# Patient Record
Sex: Male | Born: 1995
Health system: Southern US, Community
[De-identification: ages and names within clinical notes are randomized; demographics above are authoritative.]

## PROBLEM LIST (undated history)

## (undated) DIAGNOSIS — S069XAA Unspecified intracranial injury with loss of consciousness status unknown, initial encounter: Secondary | ICD-10-CM

## (undated) DIAGNOSIS — S069X9A Unspecified intracranial injury with loss of consciousness of unspecified duration, initial encounter: Secondary | ICD-10-CM

## (undated) HISTORY — PX: APPENDECTOMY: SHX54

## (undated) HISTORY — PX: WISDOM TOOTH EXTRACTION: SHX21

## (undated) HISTORY — PX: TYMPANOSTOMY TUBE PLACEMENT: SHX32

---
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
## 2019-01-28 ENCOUNTER — Emergency Department (HOSPITAL_COMMUNITY): Payer: Medicaid Other

## 2019-01-28 ENCOUNTER — Other Ambulatory Visit: Payer: Self-pay

## 2019-01-28 ENCOUNTER — Encounter: Payer: Self-pay | Admitting: Student

## 2019-01-28 ENCOUNTER — Inpatient Hospital Stay (HOSPITAL_COMMUNITY)
Admission: EM | Admit: 2019-01-28 | Discharge: 2019-02-05 | DRG: 082 | Disposition: A | Discharge status: Rehabilitation Facility | Payer: Medicaid Other | Attending: General Surgery | Admitting: General Surgery

## 2019-01-28 ENCOUNTER — Encounter (HOSPITAL_COMMUNITY): Payer: Self-pay | Admitting: Emergency Medicine

## 2019-01-28 DIAGNOSIS — R451 Restlessness and agitation: Secondary | ICD-10-CM | POA: Diagnosis not present

## 2019-01-28 DIAGNOSIS — S3993XA Unspecified injury of pelvis, initial encounter: Secondary | ICD-10-CM | POA: Diagnosis not present

## 2019-01-28 DIAGNOSIS — R0689 Other abnormalities of breathing: Secondary | ICD-10-CM | POA: Diagnosis not present

## 2019-01-28 DIAGNOSIS — Z9911 Dependence on respirator [ventilator] status: Secondary | ICD-10-CM

## 2019-01-28 DIAGNOSIS — S0219XA Other fracture of base of skull, initial encounter for closed fracture: Secondary | ICD-10-CM | POA: Diagnosis not present

## 2019-01-28 DIAGNOSIS — S3991XA Unspecified injury of abdomen, initial encounter: Secondary | ICD-10-CM | POA: Diagnosis not present

## 2019-01-28 DIAGNOSIS — S06899A Other specified intracranial injury with loss of consciousness of unspecified duration, initial encounter: Secondary | ICD-10-CM | POA: Diagnosis not present

## 2019-01-28 DIAGNOSIS — R402432 Glasgow coma scale score 3-8, at arrival to emergency department: Secondary | ICD-10-CM | POA: Diagnosis present

## 2019-01-28 DIAGNOSIS — S0291XB Unspecified fracture of skull, initial encounter for open fracture: Secondary | ICD-10-CM | POA: Diagnosis not present

## 2019-01-28 DIAGNOSIS — L8990 Pressure ulcer of unspecified site, unspecified stage: Secondary | ICD-10-CM | POA: Insufficient documentation

## 2019-01-28 DIAGNOSIS — R4182 Altered mental status, unspecified: Secondary | ICD-10-CM | POA: Diagnosis not present

## 2019-01-28 DIAGNOSIS — S0993XA Unspecified injury of face, initial encounter: Secondary | ICD-10-CM | POA: Diagnosis not present

## 2019-01-28 DIAGNOSIS — Z7289 Other problems related to lifestyle: Secondary | ICD-10-CM

## 2019-01-28 DIAGNOSIS — Z789 Other specified health status: Secondary | ICD-10-CM

## 2019-01-28 DIAGNOSIS — Z4682 Encounter for fitting and adjustment of non-vascular catheter: Secondary | ICD-10-CM | POA: Diagnosis not present

## 2019-01-28 DIAGNOSIS — G9389 Other specified disorders of brain: Secondary | ICD-10-CM | POA: Diagnosis present

## 2019-01-28 DIAGNOSIS — S069X9A Unspecified intracranial injury with loss of consciousness of unspecified duration, initial encounter: Principal | ICD-10-CM | POA: Diagnosis present

## 2019-01-28 DIAGNOSIS — S199XXA Unspecified injury of neck, initial encounter: Secondary | ICD-10-CM | POA: Diagnosis not present

## 2019-01-28 DIAGNOSIS — R456 Violent behavior: Secondary | ICD-10-CM | POA: Diagnosis not present

## 2019-01-28 DIAGNOSIS — R4020 Unspecified coma: Secondary | ICD-10-CM | POA: Diagnosis not present

## 2019-01-28 DIAGNOSIS — S299XXA Unspecified injury of thorax, initial encounter: Secondary | ICD-10-CM | POA: Diagnosis not present

## 2019-01-28 DIAGNOSIS — S0101XA Laceration without foreign body of scalp, initial encounter: Secondary | ICD-10-CM

## 2019-01-28 DIAGNOSIS — Z781 Physical restraint status: Secondary | ICD-10-CM

## 2019-01-28 DIAGNOSIS — Z20828 Contact with and (suspected) exposure to other viral communicable diseases: Secondary | ICD-10-CM | POA: Diagnosis present

## 2019-01-28 DIAGNOSIS — S0219XB Other fracture of base of skull, initial encounter for open fracture: Secondary | ICD-10-CM | POA: Diagnosis present

## 2019-01-28 DIAGNOSIS — R404 Transient alteration of awareness: Secondary | ICD-10-CM | POA: Diagnosis not present

## 2019-01-28 DIAGNOSIS — T1490XA Injury, unspecified, initial encounter: Secondary | ICD-10-CM

## 2019-01-28 DIAGNOSIS — Y92410 Unspecified street and highway as the place of occurrence of the external cause: Secondary | ICD-10-CM

## 2019-01-28 DIAGNOSIS — F17200 Nicotine dependence, unspecified, uncomplicated: Secondary | ICD-10-CM | POA: Diagnosis present

## 2019-01-28 DIAGNOSIS — J9601 Acute respiratory failure with hypoxia: Secondary | ICD-10-CM | POA: Diagnosis present

## 2019-01-28 DIAGNOSIS — Y902 Blood alcohol level of 40-59 mg/100 ml: Secondary | ICD-10-CM | POA: Diagnosis present

## 2019-01-28 DIAGNOSIS — R1110 Vomiting, unspecified: Secondary | ICD-10-CM | POA: Diagnosis present

## 2019-01-28 DIAGNOSIS — F1290 Cannabis use, unspecified, uncomplicated: Secondary | ICD-10-CM | POA: Diagnosis present

## 2019-01-28 DIAGNOSIS — S0291XA Unspecified fracture of skull, initial encounter for closed fracture: Secondary | ICD-10-CM | POA: Diagnosis present

## 2019-01-28 DIAGNOSIS — R51 Headache: Secondary | ICD-10-CM | POA: Diagnosis not present

## 2019-01-28 DIAGNOSIS — F1590 Other stimulant use, unspecified, uncomplicated: Secondary | ICD-10-CM | POA: Diagnosis present

## 2019-01-28 LAB — CBC
HCT: 49.6 % (ref 39.0–52.0)
Hemoglobin: 16.7 g/dL (ref 13.0–17.0)
MCH: 33.7 pg (ref 26.0–34.0)
MCHC: 33.7 g/dL (ref 30.0–36.0)
MCV: 100 fL (ref 80.0–100.0)
Platelets: 298 10*3/uL (ref 150–400)
RBC: 4.96 MIL/uL (ref 4.22–5.81)
RDW: 12.3 % (ref 11.5–15.5)
WBC: 15.6 10*3/uL — ABNORMAL HIGH (ref 4.0–10.5)
nRBC: 0 % (ref 0.0–0.2)

## 2019-01-28 LAB — I-STAT CHEM 8, ED
BUN: 8 mg/dL (ref 6–20)
Calcium, Ion: 1.01 mmol/L — ABNORMAL LOW (ref 1.15–1.40)
Chloride: 106 mmol/L (ref 98–111)
Creatinine, Ser: 1 mg/dL (ref 0.61–1.24)
Glucose, Bld: 104 mg/dL — ABNORMAL HIGH (ref 70–99)
HCT: 50 % (ref 39.0–52.0)
Hemoglobin: 17 g/dL (ref 13.0–17.0)
Potassium: 3.2 mmol/L — ABNORMAL LOW (ref 3.5–5.1)
Sodium: 142 mmol/L (ref 135–145)
TCO2: 20 mmol/L — ABNORMAL LOW (ref 22–32)

## 2019-01-28 LAB — COMPREHENSIVE METABOLIC PANEL
ALT: 10 U/L (ref 0–44)
AST: 22 U/L (ref 15–41)
Albumin: 4 g/dL (ref 3.5–5.0)
Alkaline Phosphatase: 57 U/L (ref 38–126)
Anion gap: 13 (ref 5–15)
BUN: 9 mg/dL (ref 6–20)
CO2: 19 mmol/L — ABNORMAL LOW (ref 22–32)
Calcium: 8.1 mg/dL — ABNORMAL LOW (ref 8.9–10.3)
Chloride: 107 mmol/L (ref 98–111)
Creatinine, Ser: 1.12 mg/dL (ref 0.61–1.24)
GFR calc Af Amer: >60 mL/min (ref >60)
GFR calc non Af Amer: >60 mL/min (ref >60)
Glucose, Bld: 109 mg/dL — ABNORMAL HIGH (ref 70–99)
Potassium: 3.2 mmol/L — ABNORMAL LOW (ref 3.5–5.1)
Sodium: 139 mmol/L (ref 135–145)
Total Bilirubin: 0.8 mg/dL (ref 0.3–1.2)
Total Protein: 6.7 g/dL (ref 6.5–8.1)

## 2019-01-28 LAB — ETHANOL: Alcohol, Ethyl (B): 50 mg/dL — ABNORMAL HIGH (ref <10)

## 2019-01-28 LAB — PROTIME-INR
INR: 1.1 (ref 0.8–1.2)
Prothrombin Time: 14.1 seconds (ref 11.4–15.2)

## 2019-01-28 LAB — LACTIC ACID, PLASMA: Lactic Acid, Venous: 5.3 mmol/L (ref 0.5–1.9)

## 2019-01-28 MED ORDER — MIDAZOLAM HCL 2 MG/2ML IJ SOLN
INTRAMUSCULAR | Status: AC
  Filled 2019-01-28: qty 6

## 2019-01-28 MED ORDER — MIDAZOLAM HCL 5 MG/5ML IJ SOLN
INTRAMUSCULAR | Status: AC | PRN
  Administered 2019-01-28: 5 mg via INTRAVENOUS

## 2019-01-28 MED ORDER — ROCURONIUM BROMIDE 50 MG/5ML IV SOLN
INTRAVENOUS | Status: AC | PRN
  Administered 2019-01-28: 100 mg via INTRAVENOUS

## 2019-01-28 MED ORDER — PROPOFOL 1000 MG/100ML IV EMUL
INTRAVENOUS | Status: AC | PRN
  Administered 2019-01-28: 15 ug/kg/min via INTRAVENOUS

## 2019-01-28 MED ORDER — MIDAZOLAM HCL 2 MG/2ML IJ SOLN
2.0000 mg | INTRAMUSCULAR | Status: DC | PRN
  Filled 2019-01-28: qty 2

## 2019-01-28 MED ORDER — ETOMIDATE 2 MG/ML IV SOLN
INTRAVENOUS | Status: AC | PRN
  Administered 2019-01-28: 20 mg via INTRAVENOUS

## 2019-01-28 MED ORDER — FENTANYL BOLUS VIA INFUSION
50.0000 ug | INTRAVENOUS | Status: DC | PRN
  Administered 2019-01-29: 50 ug via INTRAVENOUS
  Filled 2019-01-28: qty 50

## 2019-01-28 MED ORDER — IOHEXOL 300 MG/ML  SOLN
100.0000 mL | Freq: Once | INTRAMUSCULAR | Status: AC | PRN
  Administered 2019-01-28: 100 mL via INTRAVENOUS

## 2019-01-28 MED ORDER — SODIUM CHLORIDE 0.9 % IV SOLN
INTRAVENOUS | Status: AC | PRN
  Administered 2019-01-28: 1000 mL via INTRAVENOUS

## 2019-01-28 MED ORDER — CEFAZOLIN SODIUM-DEXTROSE 2-4 GM/100ML-% IV SOLN
2.0000 g | Freq: Once | INTRAVENOUS | Status: AC
  Administered 2019-01-28: 2 g via INTRAVENOUS

## 2019-01-28 MED ORDER — SUCCINYLCHOLINE CHLORIDE 20 MG/ML IJ SOLN
INTRAMUSCULAR | Status: AC | PRN
  Administered 2019-01-28: 100 mg via INTRAVENOUS

## 2019-01-28 MED ORDER — FENTANYL 2500MCG IN NS 250ML (10MCG/ML) PREMIX INFUSION
50.0000 ug/h | INTRAVENOUS | Status: DC
  Administered 2019-01-29: 01:00:00 50 ug/h via INTRAVENOUS
  Filled 2019-01-28: qty 250

## 2019-01-28 MED ORDER — PROPOFOL 1000 MG/100ML IV EMUL
0.0000 ug/kg/min | INTRAVENOUS | Status: DC
  Administered 2019-01-29 (×2): 50 ug/kg/min via INTRAVENOUS

## 2019-01-28 MED ORDER — TETANUS-DIPHTH-ACELL PERTUSSIS 5-2.5-18.5 LF-MCG/0.5 IM SUSP
0.5000 mL | Freq: Once | INTRAMUSCULAR | Status: AC
  Administered 2019-01-28: 0.5 mL via INTRAMUSCULAR

## 2019-01-28 MED ORDER — MIDAZOLAM HCL 2 MG/2ML IJ SOLN: 2.0000 mg | INTRAMUSCULAR | Status: DC | PRN

## 2019-01-28 MED ORDER — FENTANYL CITRATE (PF) 100 MCG/2ML IJ SOLN
50.0000 ug | Freq: Once | INTRAMUSCULAR | Status: AC
  Administered 2019-01-29: 50 ug via INTRAVENOUS

## 2019-01-28 NOTE — ED Notes (Signed)
Patient transported to CT 
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
## 2019-01-28 NOTE — ED Notes (Signed)
Dr Donne Hazel paged Neurosurgery on call
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
## 2019-01-29 ENCOUNTER — Encounter (HOSPITAL_COMMUNITY): Payer: Self-pay | Admitting: Emergency Medicine

## 2019-01-29 ENCOUNTER — Other Ambulatory Visit: Payer: Self-pay

## 2019-01-29 DIAGNOSIS — J9600 Acute respiratory failure, unspecified whether with hypoxia or hypercapnia: Secondary | ICD-10-CM | POA: Diagnosis not present

## 2019-01-29 DIAGNOSIS — S0219XA Other fracture of base of skull, initial encounter for closed fracture: Secondary | ICD-10-CM | POA: Diagnosis not present

## 2019-01-29 DIAGNOSIS — S0219XB Other fracture of base of skull, initial encounter for open fracture: Secondary | ICD-10-CM | POA: Diagnosis not present

## 2019-01-29 DIAGNOSIS — Y902 Blood alcohol level of 40-59 mg/100 ml: Secondary | ICD-10-CM | POA: Diagnosis present

## 2019-01-29 DIAGNOSIS — S069X0S Unspecified intracranial injury without loss of consciousness, sequela: Secondary | ICD-10-CM | POA: Diagnosis not present

## 2019-01-29 DIAGNOSIS — S069X1D Unspecified intracranial injury with loss of consciousness of 30 minutes or less, subsequent encounter: Secondary | ICD-10-CM | POA: Diagnosis not present

## 2019-01-29 DIAGNOSIS — S199XXA Unspecified injury of neck, initial encounter: Secondary | ICD-10-CM | POA: Diagnosis not present

## 2019-01-29 DIAGNOSIS — S06899A Other specified intracranial injury with loss of consciousness of unspecified duration, initial encounter: Secondary | ICD-10-CM | POA: Diagnosis not present

## 2019-01-29 DIAGNOSIS — S069X9A Unspecified intracranial injury with loss of consciousness of unspecified duration, initial encounter: Secondary | ICD-10-CM | POA: Diagnosis present

## 2019-01-29 DIAGNOSIS — J9601 Acute respiratory failure with hypoxia: Secondary | ICD-10-CM | POA: Diagnosis present

## 2019-01-29 DIAGNOSIS — S299XXA Unspecified injury of thorax, initial encounter: Secondary | ICD-10-CM | POA: Diagnosis not present

## 2019-01-29 DIAGNOSIS — R451 Restlessness and agitation: Secondary | ICD-10-CM | POA: Diagnosis not present

## 2019-01-29 DIAGNOSIS — R402432 Glasgow coma scale score 3-8, at arrival to emergency department: Secondary | ICD-10-CM | POA: Diagnosis present

## 2019-01-29 DIAGNOSIS — Z9911 Dependence on respirator [ventilator] status: Secondary | ICD-10-CM | POA: Diagnosis not present

## 2019-01-29 DIAGNOSIS — Z4682 Encounter for fitting and adjustment of non-vascular catheter: Secondary | ICD-10-CM | POA: Diagnosis not present

## 2019-01-29 DIAGNOSIS — S062X9A Diffuse traumatic brain injury with loss of consciousness of unspecified duration, initial encounter: Secondary | ICD-10-CM | POA: Diagnosis not present

## 2019-01-29 DIAGNOSIS — S3992XA Unspecified injury of lower back, initial encounter: Secondary | ICD-10-CM | POA: Diagnosis not present

## 2019-01-29 DIAGNOSIS — R51 Headache: Secondary | ICD-10-CM | POA: Diagnosis not present

## 2019-01-29 DIAGNOSIS — R4689 Other symptoms and signs involving appearance and behavior: Secondary | ICD-10-CM | POA: Diagnosis not present

## 2019-01-29 DIAGNOSIS — Z781 Physical restraint status: Secondary | ICD-10-CM | POA: Diagnosis not present

## 2019-01-29 DIAGNOSIS — F17200 Nicotine dependence, unspecified, uncomplicated: Secondary | ICD-10-CM | POA: Diagnosis present

## 2019-01-29 DIAGNOSIS — F1590 Other stimulant use, unspecified, uncomplicated: Secondary | ICD-10-CM | POA: Diagnosis present

## 2019-01-29 DIAGNOSIS — S069X9D Unspecified intracranial injury with loss of consciousness of unspecified duration, subsequent encounter: Secondary | ICD-10-CM | POA: Diagnosis not present

## 2019-01-29 DIAGNOSIS — S0291XB Unspecified fracture of skull, initial encounter for open fracture: Secondary | ICD-10-CM | POA: Diagnosis not present

## 2019-01-29 DIAGNOSIS — S0993XA Unspecified injury of face, initial encounter: Secondary | ICD-10-CM | POA: Diagnosis not present

## 2019-01-29 DIAGNOSIS — Z20828 Contact with and (suspected) exposure to other viral communicable diseases: Secondary | ICD-10-CM | POA: Diagnosis present

## 2019-01-29 DIAGNOSIS — S0291XA Unspecified fracture of skull, initial encounter for closed fracture: Secondary | ICD-10-CM | POA: Diagnosis not present

## 2019-01-29 DIAGNOSIS — S069X2S Unspecified intracranial injury with loss of consciousness of 31 minutes to 59 minutes, sequela: Secondary | ICD-10-CM | POA: Diagnosis not present

## 2019-01-29 DIAGNOSIS — S3991XA Unspecified injury of abdomen, initial encounter: Secondary | ICD-10-CM | POA: Diagnosis not present

## 2019-01-29 DIAGNOSIS — L8990 Pressure ulcer of unspecified site, unspecified stage: Secondary | ICD-10-CM | POA: Insufficient documentation

## 2019-01-29 DIAGNOSIS — S3993XA Unspecified injury of pelvis, initial encounter: Secondary | ICD-10-CM | POA: Diagnosis not present

## 2019-01-29 DIAGNOSIS — G9389 Other specified disorders of brain: Secondary | ICD-10-CM | POA: Diagnosis present

## 2019-01-29 DIAGNOSIS — Z7289 Other problems related to lifestyle: Secondary | ICD-10-CM | POA: Diagnosis not present

## 2019-01-29 DIAGNOSIS — Y92410 Unspecified street and highway as the place of occurrence of the external cause: Secondary | ICD-10-CM | POA: Diagnosis not present

## 2019-01-29 DIAGNOSIS — F1290 Cannabis use, unspecified, uncomplicated: Secondary | ICD-10-CM | POA: Diagnosis present

## 2019-01-29 DIAGNOSIS — R1110 Vomiting, unspecified: Secondary | ICD-10-CM | POA: Diagnosis present

## 2019-01-29 DIAGNOSIS — S06360D Traumatic hemorrhage of cerebrum, unspecified, without loss of consciousness, subsequent encounter: Secondary | ICD-10-CM | POA: Diagnosis not present

## 2019-01-29 LAB — PREPARE FRESH FROZEN PLASMA
Unit division: 0
Unit division: 0

## 2019-01-29 LAB — TYPE AND SCREEN
ABO/RH(D): A POS
Antibody Screen: NEGATIVE
Unit division: 0
Unit division: 0

## 2019-01-29 LAB — BPAM RBC
Blood Product Expiration Date: 202008252359
Blood Product Expiration Date: 202008252359
ISSUE DATE / TIME: 202008182217
ISSUE DATE / TIME: 202008182217
Unit Type and Rh: 5100
Unit Type and Rh: 5100

## 2019-01-29 LAB — POCT I-STAT 7, (LYTES, BLD GAS, ICA,H+H)
Acid-base deficit: 5 mmol/L — ABNORMAL HIGH (ref 0.0–2.0)
Bicarbonate: 20.4 mmol/L (ref 20.0–28.0)
Calcium, Ion: 1.12 mmol/L — ABNORMAL LOW (ref 1.15–1.40)
HCT: 48 % (ref 39.0–52.0)
Hemoglobin: 16.3 g/dL (ref 13.0–17.0)
O2 Saturation: 100 %
Potassium: 3.5 mmol/L (ref 3.5–5.1)
Sodium: 141 mmol/L (ref 135–145)
TCO2: 22 mmol/L (ref 22–32)
pCO2 arterial: 37.2 mmHg (ref 32.0–48.0)
pH, Arterial: 7.347 — ABNORMAL LOW (ref 7.350–7.450)
pO2, Arterial: 311 mmHg — ABNORMAL HIGH (ref 83.0–108.0)

## 2019-01-29 LAB — RAPID URINE DRUG SCREEN, HOSP PERFORMED
Amphetamines: NOT DETECTED
Barbiturates: NOT DETECTED
Benzodiazepines: POSITIVE — AB
Cocaine: NOT DETECTED
Opiates: NOT DETECTED
Tetrahydrocannabinol: POSITIVE — AB

## 2019-01-29 LAB — URINALYSIS, ROUTINE W REFLEX MICROSCOPIC
Bilirubin Urine: NEGATIVE
Glucose, UA: NEGATIVE mg/dL
Hgb urine dipstick: NEGATIVE
Ketones, ur: NEGATIVE mg/dL
Leukocytes,Ua: NEGATIVE
Nitrite: NEGATIVE
Protein, ur: NEGATIVE mg/dL
Specific Gravity, Urine: 1.017 (ref 1.005–1.030)
pH: 6 (ref 5.0–8.0)

## 2019-01-29 LAB — TRIGLYCERIDES: Triglycerides: 139 mg/dL (ref <150)

## 2019-01-29 LAB — CBC
HCT: 42.1 % (ref 39.0–52.0)
Hemoglobin: 14.7 g/dL (ref 13.0–17.0)
MCH: 33.3 pg (ref 26.0–34.0)
MCHC: 34.9 g/dL (ref 30.0–36.0)
MCV: 95.5 fL (ref 80.0–100.0)
Platelets: 216 10*3/uL (ref 150–400)
RBC: 4.41 MIL/uL (ref 4.22–5.81)
RDW: 12.4 % (ref 11.5–15.5)
WBC: 13.3 10*3/uL — ABNORMAL HIGH (ref 4.0–10.5)
nRBC: 0 % (ref 0.0–0.2)

## 2019-01-29 LAB — BPAM FFP
Blood Product Expiration Date: 202009032359
Blood Product Expiration Date: 202009042359
ISSUE DATE / TIME: 202008182217
ISSUE DATE / TIME: 202008182217
Unit Type and Rh: 6200
Unit Type and Rh: 6200

## 2019-01-29 LAB — BLOOD PRODUCT ORDER (VERBAL) VERIFICATION

## 2019-01-29 LAB — ABO/RH: ABO/RH(D): A POS

## 2019-01-29 LAB — MRSA PCR SCREENING: MRSA by PCR: NEGATIVE

## 2019-01-29 LAB — CDS SEROLOGY

## 2019-01-29 LAB — SARS CORONAVIRUS 2 BY RT PCR (HOSPITAL ORDER, PERFORMED IN ~~LOC~~ HOSPITAL LAB): SARS Coronavirus 2: NEGATIVE

## 2019-01-29 LAB — HIV ANTIBODY (ROUTINE TESTING W REFLEX): HIV Screen 4th Generation wRfx: NONREACTIVE

## 2019-01-29 MED ORDER — FENTANYL BOLUS VIA INFUSION
50.0000 ug | INTRAVENOUS | Status: DC | PRN
  Filled 2019-01-29: qty 50

## 2019-01-29 MED ORDER — MIDAZOLAM HCL 2 MG/2ML IJ SOLN
2.0000 mg | INTRAMUSCULAR | Status: DC | PRN
  Filled 2019-01-29: qty 2

## 2019-01-29 MED ORDER — CEFAZOLIN SODIUM-DEXTROSE 2-4 GM/100ML-% IV SOLN: 2.0000 g | Freq: Three times a day (TID) | INTRAVENOUS | Status: DC

## 2019-01-29 MED ORDER — CEFAZOLIN SODIUM-DEXTROSE 2-4 GM/100ML-% IV SOLN
2.0000 g | Freq: Three times a day (TID) | INTRAVENOUS | Status: DC
  Administered 2019-01-29 – 2019-02-01 (×10): 2 g via INTRAVENOUS
  Filled 2019-01-29 (×12): qty 100

## 2019-01-29 MED ORDER — CHLORHEXIDINE GLUCONATE 0.12% ORAL RINSE (MEDLINE KIT)
15.0000 mL | Freq: Two times a day (BID) | OROMUCOSAL | Status: DC
  Administered 2019-01-29 – 2019-02-05 (×8): 15 mL via OROMUCOSAL

## 2019-01-29 MED ORDER — SODIUM CHLORIDE 0.9 % IV SOLN
INTRAVENOUS | Status: DC
  Filled 2019-01-29 (×2): qty 1000

## 2019-01-29 MED ORDER — MIDAZOLAM HCL 2 MG/2ML IJ SOLN
2.0000 mg | INTRAMUSCULAR | Status: DC | PRN
  Administered 2019-01-29: 2 mg via INTRAVENOUS
  Filled 2019-01-29: qty 2

## 2019-01-29 MED ORDER — ONDANSETRON HCL 4 MG/2ML IJ SOLN
4.0000 mg | Freq: Four times a day (QID) | INTRAMUSCULAR | Status: DC | PRN
  Administered 2019-01-30: 10:00:00 4 mg via INTRAVENOUS
  Filled 2019-01-29: qty 2

## 2019-01-29 MED ORDER — FENTANYL CITRATE (PF) 100 MCG/2ML IJ SOLN
INTRAMUSCULAR | Status: AC
  Filled 2019-01-29: qty 2

## 2019-01-29 MED ORDER — POTASSIUM CHLORIDE 10 MEQ/100ML IV SOLN
10.0000 meq | INTRAVENOUS | Status: AC
  Administered 2019-01-29 (×2): 10 meq via INTRAVENOUS
  Filled 2019-01-29 (×2): qty 100

## 2019-01-29 MED ORDER — FENTANYL 2500MCG IN NS 250ML (10MCG/ML) PREMIX INFUSION
50.0000 ug/h | INTRAVENOUS | Status: DC
  Administered 2019-01-29 (×2): 150 ug/h via INTRAVENOUS
  Filled 2019-01-29: qty 250

## 2019-01-29 MED ORDER — POTASSIUM CHLORIDE IN NACL 20-0.9 MEQ/L-% IV SOLN
INTRAVENOUS | Status: DC
  Administered 2019-01-29 – 2019-02-02 (×4): via INTRAVENOUS
  Filled 2019-01-29 (×5): qty 1000

## 2019-01-29 MED ORDER — PROPOFOL 1000 MG/100ML IV EMUL
0.0000 ug/kg/min | INTRAVENOUS | Status: DC
  Administered 2019-01-29: 20 ug/kg/min via INTRAVENOUS
  Administered 2019-01-29: 50 ug/kg/min via INTRAVENOUS
  Administered 2019-01-30: 20 ug/kg/min via INTRAVENOUS
  Filled 2019-01-29 (×3): qty 100

## 2019-01-29 MED ORDER — SODIUM CHLORIDE 0.9 % IV SOLN
INTRAVENOUS | Status: DC
  Administered 2019-01-29: 03:00:00 via INTRAVENOUS

## 2019-01-29 MED ORDER — SODIUM CHLORIDE 0.9 % IV BOLUS (SEPSIS)
1000.0000 mL | Freq: Once | INTRAVENOUS | Status: AC
  Administered 2019-01-29: 1000 mL via INTRAVENOUS

## 2019-01-29 MED ORDER — ORAL CARE MOUTH RINSE
15.0000 mL | OROMUCOSAL | Status: DC
  Administered 2019-01-29 – 2019-01-30 (×12): 15 mL via OROMUCOSAL

## 2019-01-29 MED ORDER — ACETAMINOPHEN 650 MG RE SUPP
650.0000 mg | Freq: Once | RECTAL | Status: AC
  Administered 2019-01-29: 02:00:00 650 mg via RECTAL
  Filled 2019-01-29: qty 1

## 2019-01-29 MED ORDER — FENTANYL CITRATE (PF) 100 MCG/2ML IJ SOLN
50.0000 ug | Freq: Once | INTRAMUSCULAR | Status: AC
  Administered 2019-01-29: 50 ug via INTRAVENOUS

## 2019-01-29 MED ORDER — CHLORHEXIDINE GLUCONATE CLOTH 2 % EX PADS
6.0000 | MEDICATED_PAD | Freq: Every day | CUTANEOUS | Status: DC
  Administered 2019-01-29 – 2019-01-31 (×3): 6 via TOPICAL

## 2019-01-29 MED ORDER — ONDANSETRON 4 MG PO TBDP: 4.0000 mg | ORAL_TABLET | Freq: Four times a day (QID) | ORAL | Status: DC | PRN

## 2019-01-29 MED FILL — Medication: Qty: 1 | Status: AC
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
## 2019-01-29 NOTE — Progress Notes (Signed)
RN been slowly weaning sedation off, Propofol completely off and Fentanyl was down to 50mcg. Patient woke up and began thrashing in bed, attempting to pull ETT, kicking legs, unable to talk to and reason with patient to get him calm. Fentanyl increased and Propofol restarted, patient resting calmly. Will attempt again later today.

## 2019-01-29 NOTE — Progress Notes (Signed)
Chaplain was paged as patient's family arrived.  Patient alerted Physician and Nurse.  Chaplain spoke to the family and went with the Physician to update the family.  Chaplain supported the patient's mother as she came bedside and was very emotional.  Chaplain offered words of comfort and support.  Chaplain gave family contact in formation for the hospital so they can call the patient's unit for an update later today.  Chaplain made sure emergency contact information is correct in Epic.  Chaplain available if further needs Durward Mallard, MDiv.     01/29/19 0151  Clinical Encounter Type  Visited With Patient;Family;Health care provider  Visit Type ED;Critical Care  Referral From Nurse  Consult/Referral To Chaplain  Spiritual Encounters  Spiritual Needs Emotional  Stress Factors  Family Stress Factors Other (Comment) (General worry about patient and his condition )
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
## 2019-01-29 NOTE — Progress Notes (Signed)
No issues or problems overnight.  Patient remains sedated and intubated.  Vital signs are stable.  He is afebrile.  Patient does not awaken to noxious stimuli.  He moves all 4 extremities semi-purposefully without asymmetry.  Agree with plans to hold sedation and work toward possible extubation.  Check follow-up head CT scan in morning.  Skull fracture unlikely to require surgical intervention.

## 2019-01-30 ENCOUNTER — Other Ambulatory Visit: Payer: Self-pay

## 2019-01-30 ENCOUNTER — Inpatient Hospital Stay (HOSPITAL_COMMUNITY): Payer: Medicaid Other

## 2019-01-30 LAB — BASIC METABOLIC PANEL
Anion gap: 9 (ref 5–15)
BUN: 10 mg/dL (ref 6–20)
CO2: 17 mmol/L — ABNORMAL LOW (ref 22–32)
Calcium: 8.7 mg/dL — ABNORMAL LOW (ref 8.9–10.3)
Chloride: 114 mmol/L — ABNORMAL HIGH (ref 98–111)
Creatinine, Ser: 1.03 mg/dL (ref 0.61–1.24)
GFR calc Af Amer: >60 mL/min (ref >60)
GFR calc non Af Amer: >60 mL/min (ref >60)
Glucose, Bld: 80 mg/dL (ref 70–99)
Potassium: 3.6 mmol/L (ref 3.5–5.1)
Sodium: 140 mmol/L (ref 135–145)

## 2019-01-30 LAB — CBC
HCT: 40.7 % (ref 39.0–52.0)
Hemoglobin: 14 g/dL (ref 13.0–17.0)
MCH: 33.4 pg (ref 26.0–34.0)
MCHC: 34.4 g/dL (ref 30.0–36.0)
MCV: 97.1 fL (ref 80.0–100.0)
Platelets: 190 10*3/uL (ref 150–400)
RBC: 4.19 MIL/uL — ABNORMAL LOW (ref 4.22–5.81)
RDW: 12.9 % (ref 11.5–15.5)
WBC: 10.8 10*3/uL — ABNORMAL HIGH (ref 4.0–10.5)
nRBC: 0 % (ref 0.0–0.2)

## 2019-01-30 LAB — TRIGLYCERIDES: Triglycerides: 78 mg/dL (ref <150)

## 2019-01-30 MED ORDER — LORAZEPAM 2 MG/ML IJ SOLN
1.0000 mg | INTRAMUSCULAR | Status: DC | PRN
  Administered 2019-01-31 – 2019-02-03 (×6): 1 mg via INTRAVENOUS
  Filled 2019-01-30 (×6): qty 1

## 2019-01-30 NOTE — Procedures (Signed)
Extubation Procedure Note  Patient Details:   Name: Billy Malone DOB: 11/29/1995 MRN: [id number]   Airway Documentation:    Vent end date: 01/30/19 Vent end time: 0923   Evaluation  O2 sats: stable throughout Complications: No apparent complications Patient did tolerate procedure well. Bilateral Breath Sounds: Clear   Yes    Patient extubated per MD order. Positive cuff leak. No stridor noted. MD and RN at bedside. Patient had episode of vomit, but fine afterwards. MD made aware. Patient won't leave oxygen on, but 95% on room air.   Herbie Baltimore 01/30/2019, 9:30 AM
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
## 2019-01-30 NOTE — Progress Notes (Signed)
No events or problems overnight.  Patient opens eyes.  Appears aware.  Agitated.  Remains intubated.  Follows commands bilaterally.  Follow-up head CT scan demonstrates stable appearance of his left posterior temporal fracture.  No evidence of underlying contusion or hemorrhage.  Overall stable from my standpoint.  Continue antibiotics for 5-day course.  Can be switched to oral Keflex once patient tolerating p.o.

## 2019-01-30 NOTE — Progress Notes (Signed)
Pt transported to and from CT without complications. 

## 2019-01-31 LAB — BASIC METABOLIC PANEL WITH GFR
Anion gap: 14 (ref 5–15)
BUN: 6 mg/dL (ref 6–20)
CO2: 21 mmol/L — ABNORMAL LOW (ref 22–32)
Calcium: 9.1 mg/dL (ref 8.9–10.3)
Chloride: 106 mmol/L (ref 98–111)
Creatinine, Ser: 1.03 mg/dL (ref 0.61–1.24)
GFR calc Af Amer: >60 mL/min
GFR calc non Af Amer: >60 mL/min
Glucose, Bld: 101 mg/dL — ABNORMAL HIGH (ref 70–99)
Potassium: 3.5 mmol/L (ref 3.5–5.1)
Sodium: 141 mmol/L (ref 135–145)

## 2019-01-31 LAB — CBC
HCT: 44.4 % (ref 39.0–52.0)
Hemoglobin: 15.1 g/dL (ref 13.0–17.0)
MCH: 33 pg (ref 26.0–34.0)
MCHC: 34 g/dL (ref 30.0–36.0)
MCV: 97.2 fL (ref 80.0–100.0)
Platelets: 213 10*3/uL (ref 150–400)
RBC: 4.57 MIL/uL (ref 4.22–5.81)
RDW: 12.1 % (ref 11.5–15.5)
WBC: 10.6 10*3/uL — ABNORMAL HIGH (ref 4.0–10.5)
nRBC: 0 % (ref 0.0–0.2)

## 2019-01-31 LAB — TRIGLYCERIDES: Triglycerides: 86 mg/dL (ref <150)

## 2019-01-31 MED ORDER — METHOCARBAMOL 500 MG PO TABS
500.0000 mg | ORAL_TABLET | Freq: Four times a day (QID) | ORAL | Status: DC | PRN
  Administered 2019-02-02 (×2): 500 mg via ORAL
  Filled 2019-01-31 (×3): qty 1

## 2019-01-31 MED ORDER — MORPHINE SULFATE (PF) 2 MG/ML IV SOLN
1.0000 mg | INTRAVENOUS | Status: DC | PRN
  Administered 2019-01-31: 1 mg via INTRAVENOUS
  Administered 2019-02-02 – 2019-02-03 (×3): 2 mg via INTRAVENOUS
  Filled 2019-01-31 (×6): qty 1

## 2019-01-31 MED ORDER — ACETAMINOPHEN 325 MG PO TABS
650.0000 mg | ORAL_TABLET | Freq: Once | ORAL | Status: AC
  Administered 2019-01-31: 650 mg via ORAL
  Filled 2019-01-31: qty 2

## 2019-01-31 MED ORDER — OXYCODONE HCL 5 MG PO TABS
5.0000 mg | ORAL_TABLET | ORAL | Status: DC | PRN
  Administered 2019-02-01 – 2019-02-03 (×6): 10 mg via ORAL
  Administered 2019-02-04: 11:00:00 5 mg via ORAL
  Administered 2019-02-04 (×2): 10 mg via ORAL
  Filled 2019-01-31 (×10): qty 2

## 2019-01-31 MED ORDER — HYDRALAZINE HCL 20 MG/ML IJ SOLN: 10.0000 mg | Freq: Four times a day (QID) | INTRAMUSCULAR | Status: DC | PRN
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
## 2019-01-31 NOTE — Progress Notes (Signed)
Overall stable.  Patient extubated yesterday.  He awakens to stimuli.  He follows some simple commands intermittently.  He is agitated otherwise.  Strength appears equal.  Wound clean and dry.  Status post minimally depressed left posterior temporal fracture.  Continue antibiotics for 5-day course.  Mobilize ad lib.Billy Malone
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
## 2019-01-31 NOTE — Progress Notes (Signed)
PT Cancellation Note  Patient Details Name: Billy Malone MRN: [id number] DOB: 1996-03-13   Cancelled Treatment:    Reason Eval/Treat Not Completed: Other (comment) RN requesting hold due to just giving pt Ativan and pt is now sleeping.  Ellamae Sia, PT, DPT Acute Rehabilitation Services Pager [phone] Office [phone]    Willy Eddy 01/31/2019, 9:39 AM
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
## 2019-01-31 NOTE — Progress Notes (Signed)
SLP Cancellation Note  Patient Details Name: Billy Malone MRN: [id number] DOB: 1996-02-26   Cancelled treatment:       Reason Eval/Treat Not Completed: Medical issues which prohibited therapy(Nursing reported that the pt has received Ativan and is now asleep. RN requested that the evaluation be deferred at this time for this reason. SLP will follow up.)  Winner Valeriano I. Hardin Negus, [place], [place] Office number [phone] Pager [place] 01/31/2019, 9:02 AM

## 2019-01-31 NOTE — Care Management (Signed)
Pt unable to undergo SBIRT assessment at this time due to cognitive impairment.   Reinaldo Raddle, RN, BSN  Trauma/Neuro ICU Case Manager [phone]

## 2019-02-01 LAB — TRIGLYCERIDES: Triglycerides: 93 mg/dL (ref <150)

## 2019-02-01 MED ORDER — CEPHALEXIN 500 MG PO CAPS: 500.0000 mg | ORAL_CAPSULE | Freq: Three times a day (TID) | ORAL | Status: DC
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
## 2019-02-01 NOTE — Progress Notes (Signed)
Rehab Admissions Coordinator Note:  Per PT and OT recommendation, this patient was screened by Jhonnie Garner for appropriateness for an Inpatient Acute Rehab Consult.  At this time, we will follow for continued progress with therapies prior to requesting consult order.    Jhonnie Garner 02/01/2019, 10:43 AM  I can be reached at [phone].
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
## 2019-02-01 NOTE — Progress Notes (Signed)
SLP Cancellation Note  Patient Details Name: Billy Malone MRN: [id number] DOB: 1996-04-10   Cancelled treatment:        Patient is lethargic. RN said he has been this way all morning. ST to follow up.   Charlynne Cousins Xzayvier Fagin, MA, CCC-SLP 02/01/2019 12:58 PM

## 2019-02-02 LAB — TRIGLYCERIDES: Triglycerides: 102 mg/dL (ref <150)
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
## 2019-02-03 LAB — TRIGLYCERIDES: Triglycerides: 113 mg/dL (ref <150)

## 2019-02-03 MED ORDER — ENOXAPARIN SODIUM 40 MG/0.4ML ~~LOC~~ SOLN
40.0000 mg | SUBCUTANEOUS | Status: DC
  Administered 2019-02-03 – 2019-02-04 (×2): 40 mg via SUBCUTANEOUS
  Filled 2019-02-03 (×3): qty 0.4

## 2019-02-03 MED ORDER — POLYETHYLENE GLYCOL 3350 17 G PO PACK: 17.0000 g | PACK | Freq: Every day | ORAL | Status: DC | PRN

## 2019-02-03 MED ORDER — DOCUSATE SODIUM 100 MG PO CAPS
100.0000 mg | ORAL_CAPSULE | Freq: Two times a day (BID) | ORAL | Status: DC
  Administered 2019-02-03 – 2019-02-04 (×3): 100 mg via ORAL
  Filled 2019-02-03 (×5): qty 1

## 2019-02-03 MED ORDER — ACETAMINOPHEN 325 MG PO TABS
650.0000 mg | ORAL_TABLET | Freq: Four times a day (QID) | ORAL | Status: DC | PRN
  Administered 2019-02-04: 650 mg via ORAL
  Filled 2019-02-03: qty 2
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
## 2019-02-03 NOTE — Progress Notes (Signed)
Overall stable.  Remains confused and somewhat agitated.  Will follow commands.  Strength symmetric.  Overall stable.  Continue supportive efforts.  No new recommendations.
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
## 2019-02-03 NOTE — Progress Notes (Signed)
Inpatient Rehabilitation-Admissions Coordinator   [facility] will request IP Rehab Consult Order at this time.   Jhonnie Garner, OTR/L  Rehab Admissions Coordinator  [phone] 02/03/2019 2:39 PM
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
## 2019-02-04 LAB — TRIGLYCERIDES: Triglycerides: 74 mg/dL (ref <150)

## 2019-02-04 MED ORDER — POLYETHYLENE GLYCOL 3350 17 G PO PACK
17.0000 g | PACK | Freq: Every day | ORAL | Status: DC
  Administered 2019-02-04: 11:00:00 17 g via ORAL
  Filled 2019-02-04 (×2): qty 1

## 2019-02-04 MED ORDER — LORAZEPAM 1 MG PO TABS: 1.0000 mg | ORAL_TABLET | ORAL | Status: DC | PRN

## 2019-02-04 NOTE — Progress Notes (Addendum)
Inpatient Rehabilitation Admissions Coordinator  Inpatient rehab consult received. Mom not at bedside. I contacted Mom and arranged to meet with her today at 2pm to begin rehab assessment.  Danne Baxter, RN, MSN Rehab Admissions Coordinator [phone] 02/04/2019 11:56 AM   I met with patient's Mom at bedside to discuss goals an expectations of an inpt rehab admit. She is in agreement. He is a great candidate for CIR. I will plan to admit Wednesday.  Danne Baxter, RN, MSN Rehab Admissions Coordinator [phone] 02/04/2019 4:22 PM
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
## 2019-02-04 NOTE — Progress Notes (Signed)
Overall stable.  No new recommendations.  Continue supportive efforts.

## 2019-02-05 ENCOUNTER — Other Ambulatory Visit: Payer: Self-pay

## 2019-02-05 ENCOUNTER — Inpatient Hospital Stay (HOSPITAL_COMMUNITY)
Admission: RE | Admit: 2019-02-05 | Discharge: 2019-02-18 | DRG: 945 | Disposition: A | Discharge status: Home / Self Care | Payer: Medicaid Other | Source: Intra-hospital | Attending: Physical Medicine & Rehabilitation | Admitting: Physical Medicine & Rehabilitation

## 2019-02-05 ENCOUNTER — Encounter (HOSPITAL_COMMUNITY): Payer: Self-pay | Admitting: Nurse Practitioner

## 2019-02-05 ENCOUNTER — Encounter (HOSPITAL_COMMUNITY): Payer: Self-pay

## 2019-02-05 DIAGNOSIS — S069X2S Unspecified intracranial injury with loss of consciousness of 31 minutes to 59 minutes, sequela: Secondary | ICD-10-CM | POA: Diagnosis not present

## 2019-02-05 DIAGNOSIS — R451 Restlessness and agitation: Secondary | ICD-10-CM | POA: Diagnosis not present

## 2019-02-05 DIAGNOSIS — S0101XA Laceration without foreign body of scalp, initial encounter: Secondary | ICD-10-CM

## 2019-02-05 DIAGNOSIS — F909 Attention-deficit hyperactivity disorder, unspecified type: Secondary | ICD-10-CM | POA: Diagnosis present

## 2019-02-05 DIAGNOSIS — F419 Anxiety disorder, unspecified: Secondary | ICD-10-CM | POA: Diagnosis present

## 2019-02-05 DIAGNOSIS — R001 Bradycardia, unspecified: Secondary | ICD-10-CM | POA: Diagnosis present

## 2019-02-05 DIAGNOSIS — D62 Acute posthemorrhagic anemia: Secondary | ICD-10-CM | POA: Diagnosis present

## 2019-02-05 DIAGNOSIS — S06890D Other specified intracranial injury without loss of consciousness, subsequent encounter: Secondary | ICD-10-CM | POA: Diagnosis not present

## 2019-02-05 DIAGNOSIS — R4689 Other symptoms and signs involving appearance and behavior: Secondary | ICD-10-CM | POA: Diagnosis not present

## 2019-02-05 DIAGNOSIS — T447X5A Adverse effect of beta-adrenoreceptor antagonists, initial encounter: Secondary | ICD-10-CM | POA: Diagnosis present

## 2019-02-05 DIAGNOSIS — S06300D Unspecified focal traumatic brain injury without loss of consciousness, subsequent encounter: Secondary | ICD-10-CM | POA: Diagnosis present

## 2019-02-05 DIAGNOSIS — D72829 Elevated white blood cell count, unspecified: Secondary | ICD-10-CM | POA: Diagnosis present

## 2019-02-05 DIAGNOSIS — Z792 Long term (current) use of antibiotics: Secondary | ICD-10-CM | POA: Diagnosis not present

## 2019-02-05 DIAGNOSIS — S069X0S Unspecified intracranial injury without loss of consciousness, sequela: Secondary | ICD-10-CM | POA: Diagnosis not present

## 2019-02-05 DIAGNOSIS — S0291XB Unspecified fracture of skull, initial encounter for open fracture: Secondary | ICD-10-CM

## 2019-02-05 DIAGNOSIS — R4587 Impulsiveness: Secondary | ICD-10-CM | POA: Diagnosis present

## 2019-02-05 DIAGNOSIS — S069X1D Unspecified intracranial injury with loss of consciousness of 30 minutes or less, subsequent encounter: Secondary | ICD-10-CM | POA: Diagnosis not present

## 2019-02-05 DIAGNOSIS — G9389 Other specified disorders of brain: Secondary | ICD-10-CM | POA: Diagnosis present

## 2019-02-05 DIAGNOSIS — Y92239 Unspecified place in hospital as the place of occurrence of the external cause: Secondary | ICD-10-CM | POA: Diagnosis present

## 2019-02-05 DIAGNOSIS — F919 Conduct disorder, unspecified: Secondary | ICD-10-CM | POA: Diagnosis present

## 2019-02-05 DIAGNOSIS — R000 Tachycardia, unspecified: Secondary | ICD-10-CM | POA: Diagnosis present

## 2019-02-05 DIAGNOSIS — S069X9A Unspecified intracranial injury with loss of consciousness of unspecified duration, initial encounter: Secondary | ICD-10-CM | POA: Diagnosis present

## 2019-02-05 DIAGNOSIS — F17210 Nicotine dependence, cigarettes, uncomplicated: Secondary | ICD-10-CM | POA: Diagnosis present

## 2019-02-05 DIAGNOSIS — S0101XD Laceration without foreign body of scalp, subsequent encounter: Secondary | ICD-10-CM

## 2019-02-05 DIAGNOSIS — Z791 Long term (current) use of non-steroidal anti-inflammatories (NSAID): Secondary | ICD-10-CM | POA: Diagnosis not present

## 2019-02-05 DIAGNOSIS — S069X0D Unspecified intracranial injury without loss of consciousness, subsequent encounter: Secondary | ICD-10-CM

## 2019-02-05 DIAGNOSIS — S069XAA Unspecified intracranial injury with loss of consciousness status unknown, initial encounter: Secondary | ICD-10-CM | POA: Diagnosis present

## 2019-02-05 DIAGNOSIS — T1490XA Injury, unspecified, initial encounter: Secondary | ICD-10-CM

## 2019-02-05 DIAGNOSIS — S069X9D Unspecified intracranial injury with loss of consciousness of unspecified duration, subsequent encounter: Secondary | ICD-10-CM | POA: Diagnosis not present

## 2019-02-05 DIAGNOSIS — S0291XA Unspecified fracture of skull, initial encounter for closed fracture: Secondary | ICD-10-CM | POA: Diagnosis present

## 2019-02-05 DIAGNOSIS — S0219XD Other fracture of base of skull, subsequent encounter for fracture with routine healing: Secondary | ICD-10-CM | POA: Diagnosis not present

## 2019-02-05 MED ORDER — OXYCODONE HCL 5 MG PO TABS
5.0000 mg | ORAL_TABLET | ORAL | Status: DC | PRN
  Administered 2019-02-05: 23:00:00 5 mg via ORAL
  Administered 2019-02-06: 20:00:00 10 mg via ORAL
  Administered 2019-02-07: 06:00:00 5 mg via ORAL
  Administered 2019-02-07: 21:00:00 10 mg via ORAL
  Administered 2019-02-07: 17:00:00 5 mg via ORAL
  Administered 2019-02-08 – 2019-02-14 (×4): 10 mg via ORAL
  Filled 2019-02-05: qty 2
  Filled 2019-02-05 (×2): qty 1
  Filled 2019-02-05 (×2): qty 2
  Filled 2019-02-05: qty 1
  Filled 2019-02-05 (×5): qty 2

## 2019-02-05 MED ORDER — PROCHLORPERAZINE MALEATE 5 MG PO TABS: 5.0000 mg | ORAL_TABLET | Freq: Four times a day (QID) | ORAL | Status: DC | PRN

## 2019-02-05 MED ORDER — METHOCARBAMOL 500 MG PO TABS
500.0000 mg | ORAL_TABLET | Freq: Four times a day (QID) | ORAL | Status: DC | PRN
  Administered 2019-02-07 – 2019-02-11 (×6): 500 mg via ORAL
  Filled 2019-02-05 (×6): qty 1

## 2019-02-05 MED ORDER — PROCHLORPERAZINE 25 MG RE SUPP: 12.5000 mg | Freq: Four times a day (QID) | RECTAL | Status: DC | PRN

## 2019-02-05 MED ORDER — POLYETHYLENE GLYCOL 3350 17 G PO PACK
17.0000 g | PACK | Freq: Every day | ORAL | Status: DC
  Administered 2019-02-06 – 2019-02-12 (×5): 17 g via ORAL
  Filled 2019-02-05 (×7): qty 1

## 2019-02-05 MED ORDER — PROCHLORPERAZINE EDISYLATE 10 MG/2ML IJ SOLN: 5.0000 mg | Freq: Four times a day (QID) | INTRAMUSCULAR | Status: DC | PRN

## 2019-02-05 MED ORDER — DIPHENHYDRAMINE HCL 12.5 MG/5ML PO ELIX
12.5000 mg | ORAL_SOLUTION | Freq: Four times a day (QID) | ORAL | Status: DC | PRN
  Administered 2019-02-07: 18:00:00 25 mg via ORAL
  Filled 2019-02-05: qty 10

## 2019-02-05 MED ORDER — ENOXAPARIN SODIUM 40 MG/0.4ML ~~LOC~~ SOLN
40.0000 mg | SUBCUTANEOUS | Status: DC
  Administered 2019-02-05 – 2019-02-06 (×2): 40 mg via SUBCUTANEOUS
  Filled 2019-02-05 (×2): qty 0.4

## 2019-02-05 MED ORDER — LORAZEPAM 0.5 MG PO TABS
0.5000 mg | ORAL_TABLET | Freq: Four times a day (QID) | ORAL | Status: DC | PRN
  Administered 2019-02-05: 23:00:00 0.5 mg via ORAL
  Filled 2019-02-05: qty 1

## 2019-02-05 MED ORDER — ACETAMINOPHEN 325 MG PO TABS
325.0000 mg | ORAL_TABLET | ORAL | Status: DC | PRN
  Administered 2019-02-05 – 2019-02-07 (×3): 650 mg via ORAL
  Administered 2019-02-11: 325 mg via ORAL
  Administered 2019-02-16: 650 mg via ORAL
  Filled 2019-02-05 (×5): qty 2

## 2019-02-05 MED ORDER — LORAZEPAM 1 MG PO TABS: 1.0000 mg | ORAL_TABLET | ORAL | Status: DC | PRN

## 2019-02-05 MED ORDER — FLEET ENEMA 7-19 GM/118ML RE ENEM: 1.0000 | ENEMA | Freq: Once | RECTAL | Status: DC | PRN

## 2019-02-05 MED ORDER — LORAZEPAM 0.5 MG PO TABS
0.5000 mg | ORAL_TABLET | Freq: Once | ORAL | Status: DC
  Filled 2019-02-05: qty 1

## 2019-02-05 MED ORDER — SENNOSIDES-DOCUSATE SODIUM 8.6-50 MG PO TABS
2.0000 | ORAL_TABLET | Freq: Every day | ORAL | Status: DC
  Administered 2019-02-05: 22:00:00 1 via ORAL
  Administered 2019-02-06 – 2019-02-11 (×4): 2 via ORAL
  Filled 2019-02-05 (×5): qty 2

## 2019-02-05 MED ORDER — GUAIFENESIN-DM 100-10 MG/5ML PO SYRP: 5.0000 mL | ORAL_SOLUTION | Freq: Four times a day (QID) | ORAL | Status: DC | PRN

## 2019-02-05 MED ORDER — BISACODYL 10 MG RE SUPP: 10.0000 mg | Freq: Every day | RECTAL | Status: DC | PRN

## 2019-02-05 MED ORDER — TRAZODONE HCL 50 MG PO TABS
25.0000 mg | ORAL_TABLET | Freq: Every evening | ORAL | Status: DC | PRN
  Administered 2019-02-06: 20:00:00 50 mg via ORAL
  Filled 2019-02-05: qty 1

## 2019-02-05 MED ORDER — ALUM & MAG HYDROXIDE-SIMETH 200-200-20 MG/5ML PO SUSP: 30.0000 mL | ORAL | Status: DC | PRN

## 2019-02-05 MED ORDER — PNEUMOCOCCAL VAC POLYVALENT 25 MCG/0.5ML IJ INJ
0.5000 mL | INJECTION | INTRAMUSCULAR | Status: DC
  Filled 2019-02-05: qty 0.5

## 2019-02-05 MED ORDER — POLYETHYLENE GLYCOL 3350 17 G PO PACK
17.0000 g | PACK | Freq: Every day | ORAL | Status: DC | PRN
  Administered 2019-02-07: 07:00:00 17 g via ORAL
  Filled 2019-02-05: qty 1

## 2019-02-05 MED ORDER — CHLORHEXIDINE GLUCONATE 0.12% ORAL RINSE (MEDLINE KIT)
15.0000 mL | Freq: Two times a day (BID) | OROMUCOSAL | Status: DC
  Administered 2019-02-05 – 2019-02-08 (×3): 15 mL via OROMUCOSAL

## 2019-02-05 MED ORDER — DOCUSATE SODIUM 100 MG PO CAPS: 100.0000 mg | ORAL_CAPSULE | Freq: Two times a day (BID) | ORAL | Status: DC

## 2019-02-05 MED ORDER — POLYETHYLENE GLYCOL 3350 17 G PO PACK
17.0000 g | PACK | Freq: Once | ORAL | Status: AC
  Administered 2019-02-05: 17:00:00 17 g via ORAL
  Filled 2019-02-05: qty 1
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
## 2019-02-05 NOTE — Progress Notes (Signed)
Patient ID: Billy Malone, male   DOB: 12-23-1995, 23 y.o.   MRN: [id number] Patient was admitted to 4W07 via enclosure bed escorted by nursing staff.  Patient unable to verbalize understanding of rehab process including fall prevention policy, personal belongings policy, and visitation policy due to altered mental status.  Patient appears to be in no immediate distress at this time.  Brita Romp, RN

## 2019-02-05 NOTE — Progress Notes (Signed)
Inpatient Rehabilitation Admissions Coordinator  Inpatient rehab bed is available to admit pt today. I have notified Brooke, [place] and will make th e arrangements to admit today. I will contact his, Mom as well as RN CM , Almyra Free.   Danne Baxter, RN, MSN Rehab Admissions Coordinator [phone] 02/05/2019 9:29 AM
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
## 2019-02-05 NOTE — Progress Notes (Signed)
Patient is up refusing to go back to bed. He got out of the enclosure bed & was noted in the hallway. He is looking for his clothes. Cursing repoeatedly, thinking that one of the nurse techs is a friend of his, hugging her, then atlking to her as if she kne him, throwing up gang signs, pacing back & forth, packing some of the pictures that are in his room, accusing staff of taking his cigarettes, threatening staff about his cigarettes, changed his clothes from his gown to a shirt & shorts, etc. He was given prn oxycodone for pain & ativan for agitation/anxiety. 4 staff members were in his room asking trying to distract him. His conversations switch very quickly from one subject to another. He was asked about his Mom & he stated he didn't know her. He actually stated her full name. After a while of waiting (this started at approximately 2040) he was not  De-escalating. The on call provider was call & security was called because he started to move toward the door & get more agitated. He swung at on of the techs (the one he thought he knew), & she was asked to leave the room. Security came & spoke to the patient & he agreed to stay in his room. He was not speaking in the manner that he was before. They left & he seemed to calm down some, but would not stay in the bed. During this whole episode he continuously bent down to pick up thing off the floor that he was dropping & standing up holding his back, complaining that it hurt. It was explained to him multiple times that he was in the hospital & that he had some injuries. He denied. He was discouraged from picking things up but wouldn't let staff get it for him. He left his room & was directed back. Now 2 staff members are in the room until another solution comes. A telemonitor was ordered & the on call provider ordered another po dose of ativan, but patient was very suspicious about the last dose & when offered pain medication, refused.

## 2019-02-06 ENCOUNTER — Inpatient Hospital Stay (HOSPITAL_COMMUNITY): Payer: Self-pay

## 2019-02-06 ENCOUNTER — Inpatient Hospital Stay (HOSPITAL_COMMUNITY): Payer: Self-pay | Admitting: Speech Pathology

## 2019-02-06 DIAGNOSIS — R4689 Other symptoms and signs involving appearance and behavior: Secondary | ICD-10-CM

## 2019-02-06 DIAGNOSIS — S069X2S Unspecified intracranial injury with loss of consciousness of 31 minutes to 59 minutes, sequela: Secondary | ICD-10-CM

## 2019-02-06 DIAGNOSIS — S069X0S Unspecified intracranial injury without loss of consciousness, sequela: Secondary | ICD-10-CM

## 2019-02-06 LAB — COMPREHENSIVE METABOLIC PANEL
ALT: 14 U/L (ref 0–44)
AST: 23 U/L (ref 15–41)
Albumin: 4.1 g/dL (ref 3.5–5.0)
Alkaline Phosphatase: 60 U/L (ref 38–126)
Anion gap: 12 (ref 5–15)
BUN: 16 mg/dL (ref 6–20)
CO2: 26 mmol/L (ref 22–32)
Calcium: 9.9 mg/dL (ref 8.9–10.3)
Chloride: 99 mmol/L (ref 98–111)
Creatinine, Ser: 1.01 mg/dL (ref 0.61–1.24)
GFR calc Af Amer: >60 mL/min (ref >60)
GFR calc non Af Amer: >60 mL/min (ref >60)
Glucose, Bld: 109 mg/dL — ABNORMAL HIGH (ref 70–99)
Potassium: 4.1 mmol/L (ref 3.5–5.1)
Sodium: 137 mmol/L (ref 135–145)
Total Bilirubin: 1 mg/dL (ref 0.3–1.2)
Total Protein: 8.3 g/dL — ABNORMAL HIGH (ref 6.5–8.1)

## 2019-02-06 LAB — CBC WITH DIFFERENTIAL/PLATELET
Abs Immature Granulocytes: 0.03 10*3/uL (ref 0.00–0.07)
Basophils Absolute: 0.1 10*3/uL (ref 0.0–0.1)
Basophils Relative: 1 %
Eosinophils Absolute: 0.3 10*3/uL (ref 0.0–0.5)
Eosinophils Relative: 3 %
HCT: 48.8 % (ref 39.0–52.0)
Hemoglobin: 17.4 g/dL — ABNORMAL HIGH (ref 13.0–17.0)
Immature Granulocytes: 0 %
Lymphocytes Relative: 20 %
Lymphs Abs: 1.7 10*3/uL (ref 0.7–4.0)
MCH: 33 pg (ref 26.0–34.0)
MCHC: 35.7 g/dL (ref 30.0–36.0)
MCV: 92.4 fL (ref 80.0–100.0)
Monocytes Absolute: 0.9 10*3/uL (ref 0.1–1.0)
Monocytes Relative: 11 %
Neutro Abs: 5.5 10*3/uL (ref 1.7–7.7)
Neutrophils Relative %: 65 %
Platelets: 368 10*3/uL (ref 150–400)
RBC: 5.28 MIL/uL (ref 4.22–5.81)
RDW: 11.6 % (ref 11.5–15.5)
WBC: 8.4 10*3/uL (ref 4.0–10.5)
nRBC: 0 % (ref 0.0–0.2)

## 2019-02-06 MED ORDER — QUETIAPINE FUMARATE 50 MG PO TABS
50.0000 mg | ORAL_TABLET | Freq: Every day | ORAL | Status: DC
  Administered 2019-02-06: 20:00:00 50 mg via ORAL
  Filled 2019-02-06: qty 1

## 2019-02-06 MED ORDER — NICOTINE 21 MG/24HR TD PT24
21.0000 mg | MEDICATED_PATCH | Freq: Every day | TRANSDERMAL | Status: DC
  Administered 2019-02-06 – 2019-02-12 (×10): 21 mg via TRANSDERMAL
  Filled 2019-02-06 (×12): qty 1

## 2019-02-06 MED ORDER — QUETIAPINE FUMARATE 25 MG PO TABS
25.0000 mg | ORAL_TABLET | Freq: Two times a day (BID) | ORAL | Status: DC | PRN
  Administered 2019-02-07 (×2): 25 mg via ORAL
  Filled 2019-02-06 (×2): qty 1

## 2019-02-06 MED ORDER — LORAZEPAM 0.5 MG PO TABS
0.5000 mg | ORAL_TABLET | Freq: Three times a day (TID) | ORAL | Status: DC | PRN
  Administered 2019-02-07 (×2): 0.5 mg via ORAL
  Filled 2019-02-06 (×2): qty 1
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
## 2019-02-06 NOTE — Progress Notes (Signed)
Patient got up a few more times & sat on the side of the bed. Eventually he layed down but was awake. After standing there for a while, staff left the room while patient offered the staff to get in his bed. He was encouraged to get some rest & lights were turned down. He was still watching television at that time. He eventually went to sleep between [place] with staff sitting outside his door just in case he got back up. Enclosure bed was discontinued due to patient being able to get out & refusals to get back in. It seemed to agitate him when he got out of it earlier. A sleep chart was started. Orders were not put in due to Epic downtime tonight. A lab technician came to draw labs & asked if was safe. This nurse went in & woke patient up who was sleeping very hard. Awoken by tactile stimuli. He let her draw his labs & was noted picking at the band aid that she placed. He is now laying in bed with his eyes closed. Will continue to monitor. Telesitter is on & in place.

## 2019-02-06 NOTE — Progress Notes (Signed)
Patients mother asked about his night.  The patient told her that he was packing up his belongings and was coming home and that he didn't sleep at all.  Mother noted to be tearful given her son's condition.  RN gave her BI booklet to go home and review that discusses normal stages of recovery from [facility].  Additional educational materials placed at bedside for her when she returns.  Reassured her that patient would be monitored throughout the day, explained the reason for video monitoring, and discussed importance of sleep at night and medication regimen that will support it.  Patients mom to return sometime tomorrow.  Patient currently resting in bed, enclosure bed open, with call bell within reach and telesitter present.  Brita Romp, RN
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
## 2019-02-06 NOTE — Plan of Care (Signed)
  Problem: Consults Goal: RH BRAIN INJURY PATIENT EDUCATION Description: Description: See Patient Education module for eduction specifics Outcome: Progressing Goal: Skin Care Protocol Initiated - if Braden Score 18 or less Description: If consults are not indicated, leave blank or document N/A Outcome: Progressing   Problem: RH BOWEL ELIMINATION Goal: RH STG MANAGE BOWEL W/MEDICATION W/ASSISTANCE Description: STG Manage Bowel with Medication with mod I Assistance. Outcome: Progressing   Problem: RH KNOWLEDGE DEFICIT BRAIN INJURY Goal: RH STG INCREASE KNOWLEDGE OF SELF CARE AFTER BRAIN INJURY Description: Patient/caregiver will verbalize understanding of brain injury recovery including medications, safety, stages of recovery, etc. With min/mod assist. Outcome: Progressing   Problem: RH BOWEL ELIMINATION Goal: RH STG MANAGE BOWEL WITH ASSISTANCE Description: STG Manage Bowel with mod I Assistance. Outcome: Not Progressing   Problem: RH SAFETY Goal: RH STG ADHERE TO SAFETY PRECAUTIONS W/ASSISTANCE/DEVICE Description: STG Adhere to Safety Precautions With min/mod Assistance/Device. Outcome: Not Progressing   Problem: RH COGNITION-NURSING Goal: RH STG USES MEMORY AIDS/STRATEGIES W/ASSIST TO PROBLEM SOLVE Description: STG Uses Memory Aids/Strategies With min/mod Assistance to Problem Solve. Outcome: Not Progressing

## 2019-02-06 NOTE — Progress Notes (Signed)
Patient information reviewed and entered into [facility] by Becky Reisha Wos, PPS coordinator. Information including medical coding, function ability, and quality indicators will be reviewed and updated through discharge.   
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
## 2019-02-07 ENCOUNTER — Inpatient Hospital Stay (HOSPITAL_COMMUNITY): Payer: Self-pay | Admitting: Physical Therapy

## 2019-02-07 ENCOUNTER — Inpatient Hospital Stay (HOSPITAL_COMMUNITY): Payer: Self-pay | Admitting: Speech Pathology

## 2019-02-07 ENCOUNTER — Inpatient Hospital Stay (HOSPITAL_COMMUNITY): Payer: Self-pay

## 2019-02-07 MED ORDER — QUETIAPINE FUMARATE 50 MG PO TABS
100.0000 mg | ORAL_TABLET | Freq: Every day | ORAL | Status: DC
  Administered 2019-02-07: 21:00:00 100 mg via ORAL
  Filled 2019-02-07: qty 2

## 2019-02-07 MED ORDER — LORAZEPAM 2 MG/ML IJ SOLN: 0.5000 mg | Freq: Four times a day (QID) | INTRAMUSCULAR | Status: DC | PRN

## 2019-02-07 MED ORDER — LORAZEPAM 0.5 MG PO TABS
0.5000 mg | ORAL_TABLET | Freq: Four times a day (QID) | ORAL | Status: DC | PRN
  Administered 2019-02-07 – 2019-02-11 (×2): 0.5 mg via ORAL
  Filled 2019-02-07: qty 1

## 2019-02-07 NOTE — Progress Notes (Signed)
Physical Therapy Session Note  Patient Details  Name: Demareon Coldwell MRN: [id number] Date of Birth: 07-11-1995  Today's Date: 02/07/2019 PT Individual Time: 1006-1016 PT Individual Time Calculation (min): 10 min   Short Term Goals: Week 1:  PT Short Term Goal 1 (Week 1): Pt will ambulate 150 ft with CGA with LRAD. PT Short Term Goal 2 (Week 1): Pt will negotiate 12 steps with B rails & CGA.  Skilled Therapeutic Interventions/Progress Updates:  Pt received in bed with eyes partially closed, RN reports pt has only received muscle relaxer. Opened blinds and turned on lights to increase pt's engagement, provided tactile stimulation but pt briefly only nods/shakes head yes/no. Offered pt something to eat and to go for walk but pt continues to lie in bed with eyes closed. Pt continues to not engage with therapist despite ongoing encouragement. Pt left in bed with telesitter in room.   Therapy Documentation Precautions:  Precautions Precautions: None Precaution Comments: easily agitated Restrictions Weight Bearing Restrictions: No   General: PT Amount of Missed Time (min): 35 Minutes PT Missed Treatment Reason: Patient unwilling to participate;Patient fatigue    Therapy/Group: Individual Therapy  [name] 02/07/2019, 10:22 AM

## 2019-02-07 NOTE — Progress Notes (Signed)
Patient became increasingly restless at lunch time and began wandering the halls.  RN stayed with patient.  Patient was asking for "a blunt" to calm his nerves.  PRN Ativan and Seroquel given.  Patient continued to wander the halls with RN present.  Becoming more difficult to redirect.  Was able to get patient back to his room and agree to stay there until the MD releases him to go home.  Enclosure bed was discontinued and removed from room today.  Will continue to monitor.  Brita Romp, RN

## 2019-02-07 NOTE — Progress Notes (Signed)
Patient came to nurses station looking for cigarettes.  Patients nicotine patch that was applied earlier is gone (may have come off during shower).  Replaced nicotine patch with new one.  Directed patient back to room.  Sheet draped at doorway to act as a reminder for the patient to call before coming out of his room.  Brita Romp, RN

## 2019-02-07 NOTE — Progress Notes (Signed)
Physical Therapy Session Note  Patient Details  Name: Billy Malone MRN: [id number] Date of Birth: Jun 22, 1995  Today's Date: 02/07/2019 PT Individual Time: 1445-1530 PT Individual Time Calculation (min): 45 min   Short Term Goals: Week 1:  PT Short Term Goal 1 (Week 1): Pt will ambulate 150 ft with CGA with LRAD. PT Short Term Goal 2 (Week 1): Pt will negotiate 12 steps with B rails & CGA.  Skilled Therapeutic Interventions/Progress Updates:    Pt received seated in bed, agreeable to PT session. No complaints of pain. Bed mobility Supervision. Sit to stand with Supervision. Ambulation to dayroom at Supervision level. Session focus on multitasking and engaging in cognitive challenge with bowling and scoring bowling game. Pt requires frequent cues to attend to bowling game and adding up his own score. Pt engages in physical challenges leaning down to pick up bowling pins to reset them for next frame and reports feeling some weakness in his LLE when bending down. Pt requests to go get some food with this therapist, redirected pt with a snack from the nurses station and back to his room. Toilet transfer with Supervision. Pt cleans toilet seat and washes his hands and cleans up sink area following toileting without prompting. Pt requires some encouragement to sit on bed at end of session. Pt with some confusion and attempting to use his call button as a telephone. Pt left seated in bed with needs in reach, telesitter present, sheet draped across doorway as deterrent for attempting to exit room.  Therapy Documentation Precautions:  Precautions Precautions: None Precaution Comments: easily agitated Restrictions Weight Bearing Restrictions: No    Therapy/Group: Individual Therapy   Excell Seltzer, PT, DPT  02/07/2019, 4:21 PM
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
## 2019-02-07 NOTE — Progress Notes (Signed)
Occupational Therapy Session Note  Patient Details  Name: Billy Malone MRN: [id number] Date of Birth: Dec 10, 1995  Today's Date: 02/07/2019 OT Individual Time: 4098-1191 OT Individual Time Calculation (min): 73 min    Short Term Goals: Week 1:  OT Short Term Goal 1 (Week 1): Pt will complete bathing and dressing with no more than min VC for seated positioning/leaning for safety awarness OT Short Term Goal 2 (Week 1): Pt will don shirt with set up OT Short Term Goal 3 (Week 1): Pt will initiate grooming wiht no more than min VC OT Short Term Goal 4 (Week 1): Pt will sustain attention to task in minimally distracting environment for 5 min OT Short Term Goal 5 (Week 1): Pt will be oriented to place with MAX VC and use of external aides PRN  Skilled Therapeutic Interventions/Progress Updates:    1:1. Pt received in dayroom with RN and MD present. Pain medication already administered but complain of LE pain. Pt ambulates back to room and selects new clothing from closet with S and question cues to gather all needed items. Pt completes shower standing with TTB in bathroom with supervision. Pt very sexually inappropriate verbally towards OT asking OT to join pt in shower, talking about spanking and wanting OT to perform sexual favors for pt in shower. At one point pt stands on TTB and OT has to sternly tell pt to get down. Pt dresses in standing with superviison with VC for seated or leaning onto wall to thread BLE into pants and dons socks. Pt sits to don shoes. Pt refuses to brush teeth. Pt completes ambulation througout unit with supervision and multiple lateral and posterior LOB, but pt able to recover. Pt plays cornhole with OT requiring MAX VC and written notation to complete scoring for both pt and OT. Pt completes standing pipe tree activity with min VC for noticing errors in figure. Pt completes 2 min on kinetron in standing and personally elects to transition to Nustep d/t decreased  attention despites VC. Exited session with pt setaedin bed, exit alarm on, telesitter in place and call light tin reah  Therapy Documentation Precautions:  Precautions Precautions: None Precaution Comments: easily agitated Restrictions Weight Bearing Restrictions: No General:   Vital Signs:  Pain: Pain Assessment Pain Scale: 0-10 Pain Score: 0-No pain Pain Type: Acute pain Pain Location: Back Pain Frequency: Intermittent Pain Onset: With Activity Patients Stated Pain Goal: 0 Pain Intervention(s): Medication (See eMAR) ADL: ADL Grooming: Unable to assess, Other (comment)(pt refused) Upper Body Bathing: Unable to assess(refused) Lower Body Bathing: Unable to assess(refused) Upper Body Dressing: Contact guard Where Assessed-Upper Body Dressing: Standing at sink Lower Body Dressing: Minimal assistance Where Assessed-Lower Body Dressing: Standing at sink Toileting: Minimal assistance(stnading) Where Assessed-Toileting: Toilet(standing) Tub/Shower Transfer: Minimal assistance Tub/Shower Transfer Method: Ambulating Tub/Shower Equipment: Landscape architect   Exercises:   Other Treatments:     Therapy/Group: Individual Therapy  Tonny Branch 02/07/2019, 8:13 AM

## 2019-02-07 NOTE — Progress Notes (Signed)
[facility] PHYSICAL MEDICINE & REHABILITATION PROGRESS NOTE   Subjective/Complaints: Slept until about 0500 today. Very active this morning. Restless  ROS: Limited due to cognitive/behavioral    Objective:   No results found. Recent Labs    02/06/19 0545  WBC 8.4  HGB 17.4*  HCT 48.8  PLT 368   Recent Labs    02/06/19 0545  NA 137  K 4.1  CL 99  CO2 26  GLUCOSE 109*  BUN 16  CREATININE 1.01  CALCIUM 9.9    Intake/Output Summary (Last 24 hours) at 02/07/2019 0952 Last data filed at 02/07/2019 0745 Gross per 24 hour  Intake 415 ml  Output -  Net 415 ml     Physical Exam: Vital Signs Blood pressure 122/89, pulse (!) 59, temperature 98.6 F (37 C), resp. rate 18, height 6' (1.829 m), SpO2 98 %.  Constitutional: No distress . Vital signs reviewed. HEENT: EOMI, oral membranes moist Neck: supple Cardiovascular: RRR without murmur. No JVD    Respiratory: CTA Bilaterally without wheezes or rales. Normal effort    GI: BS +, non-tender, non-distended  Extremities: No clubbing, cyanosis, or edema. Pulses are 2+ Skin: Clean and intact without signs of breakdown. Numerous tats on all 4's and face Neuro: oriented to self, hospital. Remains distracted and impulsive, restless. Cranial nerves 2-12 are grossly intact. Sensory exam is normal. Reflexes are 2+ in all 4's. No tremors. Motor function is grossly 4-5/5.  Musculoskeletal: Full ROM, No pain with AROM or PROM in the neck, trunk, or extremities.  Psych: disinhibited      Assessment/Plan: 1. Functional deficits secondary to TBI which require 3+ hours per day of interdisciplinary therapy in a comprehensive inpatient rehab setting.  Physiatrist is providing close team supervision and 24 hour management of active medical problems listed below.  Physiatrist and rehab team continue to assess barriers to discharge/monitor patient progress toward functional and medical goals  Care Tool:  Bathing  Bathing activity did  not occur: Refused           Bathing assist       Upper Body Dressing/Undressing Upper body dressing   What is the patient wearing?: Pull over shirt    Upper body assist Assist Level: Contact Guard/Touching assist(standing)    Lower Body Dressing/Undressing Lower body dressing      What is the patient wearing?: Pants     Lower body assist Assist for lower body dressing: Minimal Assistance - Patient > 75%(standing)     Toileting Toileting    Toileting assist Assist for toileting: Supervision/Verbal cueing     Transfers Chair/bed transfer  Transfers assist     Chair/bed transfer assist level: Supervision/Verbal cueing     Locomotion Ambulation   Ambulation assist      Assist level: Minimal Assistance - Patient > 75% Assistive device: No Device Max distance: 100   Walk 10 feet activity   Assist     Assist level: Minimal Assistance - Patient > 75% Assistive device: No Device   Walk 50 feet activity   Assist    Assist level: Minimal Assistance - Patient > 75% Assistive device: No Device    Walk 150 feet activity   Assist Walk 150 feet activity did not occur: Safety/medical concerns(agitation)         Walk 10 feet on uneven surface  activity   Assist Walk 10 feet on uneven surfaces activity did not occur: Safety/medical concerns(agitation)         Wheelchair  Assist Will patient use wheelchair at discharge?: No             Wheelchair 50 feet with 2 turns activity    Assist            Wheelchair 150 feet activity     Assist          Blood pressure 122/89, pulse (!) 59, temperature 98.6 F (37 C), resp. rate 18, height 6' (1.829 m), SpO2 98 %.  Medical Problem List and Plan: 1.Imapired function/ADLs/mobility/cognitionsecondary toTBI, depressed skull fx with [employer] Level V  -Continue CIR therapies today including PT, SLP and OT  2.  Antithrombotics: -DVT/anticoagulation:Pharmaceutical:Lovenox -antiplatelet therapy: N/A 3. Pain Management:continue oxycodone prnfor headaches 4. Mood:LCSW to follow for evaluation and support. -antipsychotic agents: increase HS seroquel to 100mg  with back up dose if needed  - avoid overstimulation  -normalize sleep-wake cycle  -may benefit from trial of ritalin for improved concentration 5. Neuropsych: This patientisNOTcapable of making decisions onhisown behalf. 6. Skin/Wound Care:Routine pressure relief measures. 7. Fluids/Electrolytes/Nutrition:good po intake.  .  8. Leucocytosis: wbc 8.4   9. Left hip pain?     LOS: 2 days A FACE TO FACE EVALUATION WAS PERFORMED  Meredith Staggers 02/07/2019, 9:52 AM

## 2019-02-07 NOTE — Progress Notes (Signed)
Patient roaming halls. This nursing staff redirected patient to his rooms several times. Patient complaint of pain, PRN oxycodone was given along with scheduled seroquel and PRN trazodone. Patient in bed with call bell at bedside.

## 2019-02-07 NOTE — Progress Notes (Signed)
Patient has continued to wander and is fixated on leaving, despite the PRN Ativan and Seroquel given earlier.  RN attempted to walk patient around unit multiple times to calm him but was unsuccessful.  Was able to convince patient to return to room to find something to watch on TV and relax.  Upon entering room RN was flipping through channels to find something that may catch his attention for now and he inappropriately touched this RNs buttocks and proceeded to climb in bed and insist that I join him.  Attempted to reorient patient to his surroundings and that I am his nurse and set boundaries.  Left patient in room watching TV with telesitter present and sheet at door for cues to stay in room.  Another nurse gave patient PRN Oxycodone and Robaxin in hopes to settle patient.  Algis Liming, PA made aware of behavior and new orders received.  Will continue to monitor.  Brita Romp, RN

## 2019-02-07 NOTE — Progress Notes (Signed)
Patient noted to be roaming the halls, bringing pancake syrup to the desk saying he needed to "take them to his brother down the hall."  Redirected patient back to room easily.  Patient preferred to eat his breakfast in the dayroom as he did yesterday.  Patient speaking tangentially switching from topic to topic without awareness of doing so.  Patient unable to be reoriented, thinks he is in jail or a psych ward.  RN stayed with patient due to safety risk.  Patient complaining of bilateral thigh pain when active.  PRN tylenol and robaxin given for patients comfort.  Left patient with OT for therapy.  Will continue to monitor.  Brita Romp, RN

## 2019-02-07 NOTE — Progress Notes (Signed)
Speech Language Pathology Daily Session Note  Patient Details  Name: Billy Malone MRN: [id number] Date of Birth: Sep 26, 1995  Today's Date: 02/07/2019 SLP Individual Time: 1350-1420 SLP Individual Time Calculation (min): 30 min  Short Term Goals: Week 1: SLP Short Term Goal 1 (Week 1): Patient will utilize external aids for orientation to place, time and situation with Mod A verbal and visual cues. SLP Short Term Goal 2 (Week 1): Patient will demonstrate sustained attention to functional tasks for ~30 minutes with Mod verbal cues for redirection. SLP Short Term Goal 3 (Week 1): Patient will demonstrate basic problem solving with familiar tasks with Mod verbal cues. SLP Short Term Goal 4 (Week 1): Patient will identify 1 physical and 1 cognitive deficit with Max multimodal cues.  Skilled Therapeutic Interventions:  Skilled treatment session focused on cognition goals. SLP recieved pt sitting in bed, agreeable to therapy. Pt's lunch tray present but pt continued to assert that the lunch tray wasn't his. Pt able to engage in basic card game for ~ 15 minutes with appropriate problem solving and sustained attention however after 15 minutes pt merged game with another game and was not able to be redirected. SLP followed pt to bathroom and pt able to void. Pt remains disoriented to situation and is poor historian of family information such as recalling his son's age. Pt handed off to CSW.      Pain    Therapy/Group: Individual Therapy  Alysha Doolan 02/07/2019, 3:42 PM

## 2019-02-07 NOTE — Progress Notes (Signed)
Patient slept well through out the night. At 05:25 patient woke up on his own, very agitated and began to roam the halls. Patient stated "I got to go home my son is waiting for me". Security was called as patient became aggressive with staff.  Patient stated his back was hurting, ativan and 5mg  of oxycodone were given. Patient now in bed with call bell at bedside.

## 2019-02-08 ENCOUNTER — Inpatient Hospital Stay (HOSPITAL_COMMUNITY): Payer: Self-pay | Admitting: Physical Therapy

## 2019-02-08 ENCOUNTER — Inpatient Hospital Stay (HOSPITAL_COMMUNITY): Payer: Self-pay

## 2019-02-08 ENCOUNTER — Inpatient Hospital Stay (HOSPITAL_COMMUNITY): Payer: Self-pay | Admitting: Speech Pathology

## 2019-02-08 MED ORDER — QUETIAPINE FUMARATE 50 MG PO TABS
50.0000 mg | ORAL_TABLET | Freq: Three times a day (TID) | ORAL | Status: DC | PRN
  Administered 2019-02-08 – 2019-02-17 (×10): 50 mg via ORAL
  Filled 2019-02-08 (×10): qty 1

## 2019-02-08 MED ORDER — QUETIAPINE FUMARATE 50 MG PO TABS
150.0000 mg | ORAL_TABLET | Freq: Every day | ORAL | Status: DC
  Administered 2019-02-08 – 2019-02-10 (×3): 150 mg via ORAL
  Filled 2019-02-08: qty 3
  Filled 2019-02-08: qty 6
  Filled 2019-02-08: qty 3

## 2019-02-08 MED ORDER — PROPRANOLOL HCL 20 MG PO TABS
20.0000 mg | ORAL_TABLET | Freq: Two times a day (BID) | ORAL | Status: DC
  Administered 2019-02-08 – 2019-02-11 (×7): 20 mg via ORAL
  Filled 2019-02-08 (×7): qty 1

## 2019-02-08 NOTE — Progress Notes (Signed)
Speech Language Pathology Daily Session Note  Patient Details  Name: Alaric Gladwin MRN: [id number] Date of Birth: Nov 14, 1995  Today's Date: 02/08/2019 SLP Individual Time: 1331-1416 SLP Individual Time Calculation (min): 45 min  Short Term Goals: Week 1: SLP Short Term Goal 1 (Week 1): Patient will utilize external aids for orientation to place, time and situation with Mod A verbal and visual cues. SLP Short Term Goal 2 (Week 1): Patient will demonstrate sustained attention to functional tasks for ~30 minutes with Mod verbal cues for redirection. SLP Short Term Goal 3 (Week 1): Patient will demonstrate basic problem solving with familiar tasks with Mod verbal cues. SLP Short Term Goal 4 (Week 1): Patient will identify 1 physical and 1 cognitive deficit with Max multimodal cues.  Skilled Therapeutic Interventions:  Pt was seen for skilled ST targeting cognitive goals.  Pt was in the bathroom with his mom upon therapist's arrival.  Pt slightly restless, looking for cigarettes but was easily redirected.  Pt was initially oriented to place and situation but this fluctuated throughout session.  SLP facilitated the session with a novel card game to address attention and problem solving goals.  Pt was able to sustain attention to task for its duration (~15 min) with mod cues for redirection but needed total assist to plan and execute a problem solving strategy.  Pt recalled route to room with min assist and was left with mom at bedside.  Continue per current plan of care.    Pain Pain Assessment Pain Scale: 0-10 Pain Score: 0-No pain  Therapy/Group: Individual Therapy  Mery Guadalupe, Selinda Orion 02/08/2019, 4:38 PM

## 2019-02-08 NOTE — IPOC Note (Signed)
Overall Plan of Care [facility](IPOC) Patient Details Name: Billy Malone MRN: [id number] DOB: 03-Aug-1995  Admitting Diagnosis: TBI (traumatic brain injury) [facility](HCC)  Hospital Problems: Principal Problem:   [facility]traumatic [facility]) [facility])     Functional Problem List: Nursing Behavior, Bowel, Endurance, Medication Management, Motor, Safety, Skin Integrity  PT Balance, Behavior, Endurance, Safety, Motor  OT Balance, Behavior, Cognition, Endurance, Pain, Safety  SLP Cognition  TR         Basic ADL's: OT Grooming, Bathing, Dressing, Toileting     Advanced  ADL's: OT       Transfers: PT Bed Mobility, Bed to Chair, Car, Furniture, Floor  OT Toilet, Tub/Shower     Locomotion: PT Ambulation, Stairs     Additional Impairments: OT None  SLP Social Cognition   Social Interaction, Problem Solving, Memory, Attention, Awareness  TR      Anticipated Outcomes Item Anticipated Outcome  Self Feeding S  Swallowing      Basic self-care  S  Toileting  S   Bathroom Transfers S  Bowel/Bladder  Mod I assist  Transfers  Supervision basic, car, floor  Locomotion  Supervision gait x 150', up/down 12 steps 2 rails, up/down 2 steps no rails  Communication     Cognition  Min A  Pain  < 3  Safety/Judgment  Min assist   Therapy Plan: PT Intensity: Minimum of 1-2 x/day ,45 to 90 minutes PT Frequency: 5 out of 7 days PT Duration Estimated Length of Stay: 13-16 OT Intensity: Minimum of 1-2 x/day, 45 to 90 minutes OT Frequency: 5 out of 7 days OT Duration/Estimated Length of Stay: 10-12 SLP Intensity: Minumum of 1-2 x/day, 30 to 90 minutes SLP Frequency: 3 to 5 out of 7 days SLP Duration/Estimated Length of Stay: 12-14 days   Due to the current state of emergency, patients may not be receiving their 3-hours of Medicare-mandated therapy.   Team Interventions: Nursing Interventions Patient/Family Education, Bowel Management, Disease Management/Prevention, Medication Management,  Discharge Planning, Psychosocial Support, Cognitive Remediation/Compensation, Skin Care/Wound Management  PT interventions Ambulation/gait training, Cognitive remediation/compensation, Discharge planning, DME/adaptive equipment instruction, Functional mobility training, Pain management, Psychosocial support, Splinting/orthotics, Therapeutic Activities, UE/LE Strength taining/ROM, Visual/perceptual remediation/compensation, Warden/rangerBalance/vestibular training, Community reintegration, Neuromuscular re-education, Equities traderatient/family education, Museum/gallery curatortair training, Therapeutic Exercise, UE/LE Coordination activities, Wheelchair propulsion/positioning  OT Interventions Warden/rangerBalance/vestibular training, Discharge planning, Pain management, Self Care/advanced ADL retraining, Therapeutic Activities, UE/LE Coordination activities, Cognitive remediation/compensation, Disease mangement/prevention, Functional mobility training, Patient/family education, Skin care/wound managment, Therapeutic Exercise, Visual/perceptual remediation/compensation, FirefighterCommunity reintegration, Fish farm managerDME/adaptive equipment instruction, Neuromuscular re-education, Psychosocial support, Splinting/orthotics, UE/LE Strength taining/ROM, Wheelchair propulsion/positioning  SLP Interventions Cognitive remediation/compensation, Internal/external aids, Therapeutic Activities, Environmental controls, Financial traderCueing hierarchy, Functional tasks, Patient/family education  TR Interventions    SW/CM Interventions Discharge Planning, Psychosocial Support, Patient/Family Education   Barriers to Discharge MD  Behavior  Nursing      PT      OT      SLP      SW       Team Discharge Planning: Destination: PT-Home ,OT- Home , SLP-Home Projected Follow-up: PT-Home health PT, OT-  Outpatient OT, SLP-Home Health SLP, Outpatient SLP, 24 hour supervision/assistance Projected Equipment Needs: PT-To be determined, OT- Tub/shower seat, SLP-None recommended by SLP Equipment Details: PT- , OT-   Patient/family involved in discharge planning: PT- Family member/caregiver,  OT-Patient unable/family or caregiver not available, SLP-Patient  MD ELOS: 10-14 days Medical Rehab Prognosis:  Excellent Assessment: The patient has been admitted for CIR therapies with the diagnosis of TBI.  The team will be addressing functional mobility, strength, stamina, balance, safety, adaptive techniques and equipment, self-care, bowel and bladder mgt, patient and caregiver education, NMR, cognitive-behavioral rx, community reentry. Goals have been set at supervision for basic mobility and self-care and supervision to min assist for cognition.   Due to the current state of emergency, patients may not be receiving their 3 hours per day of Medicare-mandated therapy.    Meredith Staggers, MD, FAAPMR      See Team Conference Notes for weekly updates to the plan of care

## 2019-02-08 NOTE — Progress Notes (Signed)
Occupational Therapy Session Note  Patient Details  Name: Billy Malone MRN: [id number] Date of Birth: 07-30-1995  Today's Date: 02/08/2019 OT Missed Time: 75 Minutes Missed Time Reason: Patient fatigue  Skilled Therapeutic Interventions/Progress Updates:    Pt asleep in room upon entering room. Attempted to arouse pt but pt unwilling/unable to open eyes. Pt given multiples opportunities to participate but he declined, keeping eyes closed. Pt left supine with all needs within reach.   Therapy Documentation Precautions:  Precautions Precautions: None Precaution Comments: easily agitated Restrictions Weight Bearing Restrictions: No   Therapy/Group: Individual Therapy  Curtis Sites 02/08/2019, 7:17 AM

## 2019-02-08 NOTE — Progress Notes (Signed)
Physical Therapy Session Note  Patient Details  Name: Billy Malone MRN: [id number] Date of Birth: 1995-11-19  Today's Date: 02/08/2019 PT Individual Time: 1100-1120 PT Individual Time Calculation (min): 20 min   Short Term Goals: Week 1:  PT Short Term Goal 1 (Week 1): Pt will ambulate 150 ft with CGA with LRAD. PT Short Term Goal 2 (Week 1): Pt will negotiate 12 steps with B rails & CGA.  Skilled Therapeutic Interventions/Progress Updates:  Pt was sleeping upon entering room. Pt aroused to name. Pt quickly concerned he ad slept in too late today. Pt independent with bed mobility. Pt is S for transfers and ambulation around room. Pt able to pick items up off the floor with S and no loss of balance. Pt performed toilet transfers with S. Pt returned to bed after toileting. Pt stated he had been up too late last night and was too tired to participate with therapy. Attempted to encourage participation pt continued to state he was too tired. Pt requested a drink. Walked out to nurses station to get drink and breakfast. Pt walked out to nurses station with S. Pt returned to room with therapist. Pt set up in bed with breakfast. Pt left sitting up in bed with breakfast and bed alarm on.   Therapy Documentation Precautions:  Precautions Precautions: None Precaution Comments: easily agitated Restrictions Weight Bearing Restrictions: No General: PT Amount of Missed Time (min): 40 Minutes PT Missed Treatment Reason: Patient fatigue;Patient unwilling to participate Vital Signs:  Pain: No c/o pain.    Therapy/Group: Individual Therapy  Dub Amis 02/08/2019, 11:54 AM

## 2019-02-08 NOTE — Progress Notes (Signed)
[facility] PHYSICAL MEDICINE & REHABILITATION PROGRESS NOTE   Subjective/Complaints: Restless last night up until late. Wandered. Sleeping when I arrived in room this morning  ROS: limited    Objective:   No results found. Recent Labs    02/06/19 0545  WBC 8.4  HGB 17.4*  HCT 48.8  PLT 368   Recent Labs    02/06/19 0545  NA 137  K 4.1  CL 99  CO2 26  GLUCOSE 109*  BUN 16  CREATININE 1.01  CALCIUM 9.9    Intake/Output Summary (Last 24 hours) at 02/08/2019 0855 Last data filed at 02/07/2019 1700 Gross per 24 hour  Intake 462 ml  Output -  Net 462 ml     Physical Exam: Vital Signs Blood pressure (!) 124/92, pulse 100, temperature 97.8 F (36.6 C), temperature source Oral, resp. rate 19, height 6' (1.829 m), SpO2 100 %.  Constitutional: No distress . Vital signs reviewed. HEENT: EOMI, oral membranes moist Neck: supple Cardiovascular: RRR without murmur. No JVD    Respiratory: CTA Bilaterally without wheezes or rales. Normal effort    GI: BS +, non-tender, non-distended  Extremities: No clubbing, cyanosis, or edema. Pulses are 2+ Skin: Clean and intact without signs of breakdown. Numerous tats on all 4's and face Neuro: resting this morning when I came in. Cranial nerves 2-12 are grossly intact. Sensory exam is normal. Reflexes are 2+ in all 4's. No tremors. Motor function is grossly 4-5/5.  Musculoskeletal: Full ROM, No pain with AROM or PROM in the neck, trunk, or extremities.  Psych: disinhibited      Assessment/Plan: 1. Functional deficits secondary to TBI which require 3+ hours per day of interdisciplinary therapy in a comprehensive inpatient rehab setting.  Physiatrist is providing close team supervision and 24 hour management of active medical problems listed below.  Physiatrist and rehab team continue to assess barriers to discharge/monitor patient progress toward functional and medical goals  Care Tool:  Bathing  Bathing activity did not  occur: Refused     Body parts bathed by helper: Right arm, Chest, Left arm, Abdomen, Front perineal area, Buttocks, Right upper leg, Left upper leg, Right lower leg, Left lower leg, Face     Bathing assist Assist Level: Supervision/Verbal cueing(MAX VC for attention to task/ redirection to appropriate comments)     Upper Body Dressing/Undressing Upper body dressing   What is the patient wearing?: Pull over shirt    Upper body assist Assist Level: Supervision/Verbal cueing    Lower Body Dressing/Undressing Lower body dressing      What is the patient wearing?: Pants     Lower body assist Assist for lower body dressing: Independent     Toileting Toileting    Toileting assist Assist for toileting: Independent     Transfers Chair/bed transfer  Transfers assist     Chair/bed transfer assist level: Independent     Locomotion Ambulation   Ambulation assist      Assist level: Supervision/Verbal cueing Assistive device: No Device Max distance: 150'   Walk 10 feet activity   Assist     Assist level: Supervision/Verbal cueing Assistive device: No Device   Walk 50 feet activity   Assist    Assist level: Supervision/Verbal cueing Assistive device: No Device    Walk 150 feet activity   Assist Walk 150 feet activity did not occur: Safety/medical concerns(agitation)  Assist level: Supervision/Verbal cueing Assistive device: No Device    Walk 10 feet on uneven surface  activity  Assist Walk 10 feet on uneven surfaces activity did not occur: Safety/medical concerns(agitation)         Wheelchair     Assist Will patient use wheelchair at discharge?: No             Wheelchair 50 feet with 2 turns activity    Assist            Wheelchair 150 feet activity     Assist          Blood pressure (!) 124/92, pulse 100, temperature 97.8 F (36.6 C), temperature source Oral, resp. rate 19, height 6' (1.829 m), SpO2 100  %.  Medical Problem List and Plan: 1.Imapired function/ADLs/mobility/cognitionsecondary toTBI, depressed skull fx with [employer] Level V  -Continue CIR therapies today including PT, SLP and OT  2. Antithrombotics: -DVT/anticoagulation:Pharmaceutical:Lovenox -antiplatelet therapy: N/A 3. Pain Management:continue oxycodone prnfor headaches 4. Mood:LCSW to follow for evaluation and support. -antipsychotic agents: increase HS seroquel to 150mg  at 2000 with 50mg  q8 hrs if needed  - avoid overstimulation  -normalize sleep-wake cycle  -begin propranolol 20mg  bid for mood stabilization  -ideally would benefit from ritalin for attention but hesitate given his substance abuse hx 5. Neuropsych: This patientisNOTcapable of making decisions onhisown behalf. 6. Skin/Wound Care:Routine pressure relief measures. 7. Fluids/Electrolytes/Nutrition:good po intake.  .  8. Leucocytosis: wbc 8.4        LOS: 3 days A FACE TO [place] 02/08/2019, 8:55 AM

## 2019-02-09 ENCOUNTER — Inpatient Hospital Stay (HOSPITAL_COMMUNITY): Payer: Self-pay

## 2019-02-09 NOTE — Plan of Care (Signed)
  Problem: Consults Goal: RH BRAIN INJURY PATIENT EDUCATION Description: Description: See Patient Education module for eduction specifics Outcome: Progressing Goal: Skin Care Protocol Initiated - if Braden Score 18 or less Description: If consults are not indicated, leave blank or document N/A Outcome: Progressing   Problem: RH BOWEL ELIMINATION Goal: RH STG MANAGE BOWEL WITH ASSISTANCE Description: STG Manage Bowel with mod I Assistance. Outcome: Progressing Goal: RH STG MANAGE BOWEL W/MEDICATION W/ASSISTANCE Description: STG Manage Bowel with Medication with mod I Assistance. Outcome: Progressing   Problem: RH SAFETY Goal: RH STG ADHERE TO SAFETY PRECAUTIONS W/ASSISTANCE/DEVICE Description: STG Adhere to Safety Precautions With min/mod Assistance/Device. Outcome: Not Progressing   Problem: RH COGNITION-NURSING Goal: RH STG USES MEMORY AIDS/STRATEGIES W/ASSIST TO PROBLEM SOLVE Description: STG Uses Memory Aids/Strategies With min/mod Assistance to Problem Solve. Outcome: Not Progressing   Problem: RH KNOWLEDGE DEFICIT BRAIN INJURY Goal: RH STG INCREASE KNOWLEDGE OF SELF CARE AFTER BRAIN INJURY Description: Patient/caregiver will verbalize understanding of brain injury recovery including medications, safety, stages of recovery, etc. With min/mod assist. Outcome: Not Progressing

## 2019-02-09 NOTE — Care Management (Signed)
[facility] Individual Statement of Services  Patient Name:  Billy Malone  Date:  02/09/2019  Welcome to the [facility].  Our goal is to provide you with an individualized program based on your diagnosis and situation, designed to meet your specific needs.  With this comprehensive rehabilitation program, you will be expected to participate in at least 3 hours of rehabilitation therapies Monday-Friday, with modified therapy programming on the weekends.  Your rehabilitation program will include the following services:  Physical Therapy (PT), Occupational Therapy (OT), Speech Therapy (ST), 24 hour per day rehabilitation nursing, Therapeutic Recreaction (TR), Neuropsychology, Case Management (Social Worker), Rehabilitation Medicine, Nutrition Services and Pharmacy Services  Weekly team conferences will be held on Tuesdays to discuss your progress.  Your Social Worker will talk with you frequently to get your input and to update you on team discussions.  Team conferences with you and your family in attendance may also be held.  Expected length of stay: 14 days   Overall anticipated outcome: supervision  Depending on your progress and recovery, your program may change. Your Social Worker will coordinate services and will keep you informed of any changes. Your Social Worker's name and contact numbers are listed  below.  The following services may also be recommended but are not provided by the [facility] will be made to provide these services after discharge if needed.  Arrangements include referral to agencies that provide these services.  Your insurance has been verified to be:  None  Your primary doctor is:  None  Pertinent information will be shared with your doctor and your insurance  company.  Social Worker:  [place], [place] or [phone]   Information discussed with and copy given to patient by: Lennart Pall, 02/09/2019, 4:09 PM

## 2019-02-09 NOTE — Progress Notes (Signed)
[facility] PHYSICAL MEDICINE & REHABILITATION PROGRESS NOTE   Subjective/Complaints: Pt up most of night. Was not agitated or irritable. Sounds as if he was up late a lot at home as well  ROS: Limited due to cognitive/behavioral   Objective:   No results found. No results for input(s): WBC, HGB, HCT, PLT in the last 72 hours. No results for input(s): NA, K, CL, CO2, GLUCOSE, BUN, CREATININE, CALCIUM in the last 72 hours.  Intake/Output Summary (Last 24 hours) at 02/09/2019 0929 Last data filed at 02/09/2019 0847 Gross per 24 hour  Intake 760 ml  Output -  Net 760 ml     Physical Exam: Vital Signs Blood pressure 127/66, pulse (!) 58, temperature 98 F (36.7 C), temperature source Oral, resp. rate 18, height 6' (1.829 m), SpO2 100 %.  Constitutional: No distress . Vital signs reviewed. HEENT: EOMI, oral membranes moist Neck: supple Cardiovascular: RRR without murmur. No JVD    Respiratory: CTA Bilaterally without wheezes or rales. Normal effort    GI: BS +, non-tender, non-distended   Extremities: No clubbing, cyanosis, or edema. Pulses are 2+ Skin: Clean and intact without signs of breakdown. Numerous tats on all 4's and face Neuro: resting this morning. Aroused easily.  Cranial nerves 2-12 are grossly intact. Sensory exam is normal. Reflexes are 2+ in all 4's. No tremors. Motor function is grossly 4-5/5.  Musculoskeletal: Full ROM, No pain with AROM or PROM in the neck, trunk, or extremities.  Psych: disinhibited and restless      Assessment/Plan: 1. Functional deficits secondary to TBI which require 3+ hours per day of interdisciplinary therapy in a comprehensive inpatient rehab setting.  Physiatrist is providing close team supervision and 24 hour management of active medical problems listed below.  Physiatrist and rehab team continue to assess barriers to discharge/monitor patient progress toward functional and medical goals  Care Tool:  Bathing  Bathing activity  did not occur: Refused     Body parts bathed by helper: Right arm, Chest, Left arm, Abdomen, Front perineal area, Buttocks, Right upper leg, Left upper leg, Right lower leg, Left lower leg, Face     Bathing assist Assist Level: Supervision/Verbal cueing(MAX VC for attention to task/ redirection to appropriate comments)     Upper Body Dressing/Undressing Upper body dressing   What is the patient wearing?: Pull over shirt    Upper body assist Assist Level: Supervision/Verbal cueing    Lower Body Dressing/Undressing Lower body dressing      What is the patient wearing?: Pants     Lower body assist Assist for lower body dressing: Independent     Toileting Toileting    Toileting assist Assist for toileting: Independent     Transfers Chair/bed transfer  Transfers assist     Chair/bed transfer assist level: Supervision/Verbal cueing     Locomotion Ambulation   Ambulation assist      Assist level: Supervision/Verbal cueing Assistive device: No Device Max distance: 150   Walk 10 feet activity   Assist     Assist level: Supervision/Verbal cueing Assistive device: No Device   Walk 50 feet activity   Assist    Assist level: Supervision/Verbal cueing Assistive device: No Device    Walk 150 feet activity   Assist Walk 150 feet activity did not occur: Safety/medical concerns(agitation)  Assist level: Supervision/Verbal cueing Assistive device: No Device    Walk 10 feet on uneven surface  activity   Assist Walk 10 feet on uneven surfaces activity did not  occur: Safety/medical concerns(agitation)         Wheelchair     Assist Will patient use wheelchair at discharge?: No             Wheelchair 50 feet with 2 turns activity    Assist            Wheelchair 150 feet activity     Assist          Blood pressure 127/66, pulse (!) 58, temperature 98 F (36.7 C), temperature source Oral, resp. rate 18, height 6'  (1.829 m), SpO2 100 %.  Medical Problem List and Plan: 1.Imapired function/ADLs/mobility/cognitionsecondary toTBI, depressed skull fx with [employer] Level V  -Continue CIR therapies today including PT, SLP and OT  2. Antithrombotics: -DVT/anticoagulation:Pharmaceutical:Lovenox -antiplatelet therapy: N/A 3. Pain Management:continue oxycodone prnfor headaches 4. Mood:LCSW to follow for evaluation and support. -antipsychotic agents: continue HS seroquel to 150mg  at 2000 with 50mg  q8 hrs if needed  -continue to  avoid overstimulation  -normalizing sleep-wake cycle may be difficult given prior sleep habits  -continue propranolol 20mg  bid for mood stabilization  -ideally would benefit from ritalin for attention but hesitate given his substance abuse hx 5. Neuropsych: This patientisNOTcapable of making decisions onhisown behalf. 6. Skin/Wound Care:Routine pressure relief measures. 7. Fluids/Electrolytes/Nutrition:good po intake.  .  8. Leucocytosis: wbc 8.4        LOS: 4 days A FACE TO FACE EVALUATION WAS PERFORMED  Ranelle OysterZachary T  02/09/2019, 9:29 AM
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
## 2019-02-09 NOTE — Progress Notes (Signed)
Patient asleep from about 0730-1300. Patient cooperative but alert to self only upon awakening. Patient refused pain medications but did take nicotine patch-reinforced with tegaderm. Patient mom currently at bedside. Patient perseverating on needing a cigarette but redirectable at this time. Continue to monitor.

## 2019-02-10 ENCOUNTER — Inpatient Hospital Stay (HOSPITAL_COMMUNITY): Payer: Self-pay | Admitting: Speech Pathology

## 2019-02-10 ENCOUNTER — Inpatient Hospital Stay (HOSPITAL_COMMUNITY): Payer: Self-pay

## 2019-02-10 MED ORDER — ENSURE ENLIVE PO LIQD: 237.0000 mL | Freq: Two times a day (BID) | ORAL | Status: DC

## 2019-02-10 NOTE — Progress Notes (Signed)
Speech Language Pathology Daily Session Note  Patient Details  Name: Greogry Goodwyn MRN: [id number] Date of Birth: 1996/03/07  Today's Date: 02/10/2019 SLP Individual Time: 1330-1410 SLP Individual Time Calculation (min): 40 min  Short Term Goals: Week 1: SLP Short Term Goal 1 (Week 1): Patient will utilize external aids for orientation to place, time and situation with Mod A verbal and visual cues. SLP Short Term Goal 2 (Week 1): Patient will demonstrate sustained attention to functional tasks for ~30 minutes with Mod verbal cues for redirection. SLP Short Term Goal 3 (Week 1): Patient will demonstrate basic problem solving with familiar tasks with Mod verbal cues. SLP Short Term Goal 4 (Week 1): Patient will identify 1 physical and 1 cognitive deficit with Max multimodal cues.  Skilled Therapeutic Interventions: Skilled treatment session focused on cognitive goals. SLP facilitated session by providing Mod A verbal cues for functional problem solving during tray set-up.  SLP also facilitated session by providing Max A verbal cues for sustained attention to tasks but overall supervision for basic problem solving with a money management task. Patient with continued restlessness and language of confusion with perseveration on smoking, etc but could be redirected this session. Patient left upright in bed with all needs within reach. Continue with current plan of care.      Pain No/Denies Pain   Therapy/Group: Individual Therapy  Jacquel Redditt 02/10/2019, 3:11 PM

## 2019-02-10 NOTE — Progress Notes (Signed)
[facility] PHYSICAL MEDICINE & REHABILITATION PROGRESS NOTE   Subjective/Complaints: Per sleep chart, pt slept most of the night- he denies pain and said he slept OK.  There is a simple light barrier at door to remind pt to not leave room- found in room/in bed, acting more appropriately this AM- asking for more food- finished breakfast tray,.   ROS: Limited due to cognitive/behavioral   Objective:   No results found. No results for input(s): WBC, HGB, HCT, PLT in the last 72 hours. No results for input(s): NA, K, CL, CO2, GLUCOSE, BUN, CREATININE, CALCIUM in the last 72 hours.  Intake/Output Summary (Last 24 hours) at 02/10/2019 0921 Last data filed at 02/09/2019 1900 Gross per 24 hour  Intake 480 ml  Output -  Net 480 ml     Physical Exam: Vital Signs Blood pressure 115/68, pulse 69, temperature 97.8 F (36.6 C), temperature source Oral, resp. rate 17, height 6' (1.829 m), SpO2 100 %.  Constitutional: No distress . Vital signs reviewed. Awake, more alert, more appropriate, sitting up in bed, perseverating on wanting more food, NAD HEENT: EOMI, oral membranes moist Neck: supple Cardiovascular: RRR without murmur. No JVD    Respiratory: CTA Bilaterally without wheezes or rales. Normal effort    GI: BS +, non-tender, non-distended   Extremities: No clubbing, cyanosis, or edema. Pulses are 2+ Skin: Clean and intact without signs of breakdown. Numerous tats on all 4's and face Neuro: resting this morning. Aroused easily.  Cranial nerves 2-12 are grossly intact. Sensory exam is normal. Reflexes are 2+ in all 4's. No tremors. Motor function is grossly 4-5/5.  Musculoskeletal: Full ROM, No pain with AROM or PROM in the neck, trunk, or extremities.  Psych: disinhibited and restless usually, but better this AM      Assessment/Plan: 1. Functional deficits secondary to TBI which require 3+ hours per day of interdisciplinary therapy in a comprehensive inpatient rehab  setting.  Physiatrist is providing close team supervision and 24 hour management of active medical problems listed below.  Physiatrist and rehab team continue to assess barriers to discharge/monitor patient progress toward functional and medical goals  Care Tool:  Bathing  Bathing activity did not occur: Refused Body parts bathed by patient: Right arm, Left upper leg, Right lower leg, Left arm, Chest, Front perineal area, Left lower leg, Abdomen, Face, Buttocks, Right upper leg   Body parts bathed by helper: Right arm, Chest, Left arm, Abdomen, Front perineal area, Buttocks, Right upper leg, Left upper leg, Right lower leg, Left lower leg, Face     Bathing assist Assist Level: Supervision/Verbal cueing     Upper Body Dressing/Undressing Upper body dressing   What is the patient wearing?: Pull over shirt    Upper body assist Assist Level: Independent    Lower Body Dressing/Undressing Lower body dressing      What is the patient wearing?: Pants     Lower body assist Assist for lower body dressing: Independent     Toileting Toileting    Toileting assist Assist for toileting: Independent     Transfers Chair/bed transfer  Transfers assist     Chair/bed transfer assist level: Independent     Locomotion Ambulation   Ambulation assist      Assist level: Supervision/Verbal cueing Assistive device: No Device Max distance: 150   Walk 10 feet activity   Assist     Assist level: Supervision/Verbal cueing Assistive device: No Device   Walk 50 feet activity   Assist  Assist level: Supervision/Verbal cueing Assistive device: No Device    Walk 150 feet activity   Assist Walk 150 feet activity did not occur: Safety/medical concerns(agitation)  Assist level: Supervision/Verbal cueing Assistive device: No Device    Walk 10 feet on uneven surface  activity   Assist Walk 10 feet on uneven surfaces activity did not occur: Safety/medical  concerns(agitation)         Wheelchair     Assist Will patient use wheelchair at discharge?: No             Wheelchair 50 feet with 2 turns activity    Assist            Wheelchair 150 feet activity     Assist          Blood pressure 115/68, pulse 69, temperature 97.8 F (36.6 C), temperature source Oral, resp. rate 17, height 6' (1.829 m), SpO2 100 %.  Medical Problem List and Plan: 1.Imapired function/ADLs/mobility/cognitionsecondary toTBI, depressed skull fx with [employer] Level V  -Continue CIR therapies today including PT, SLP and OT  8/31- V to VI [place] score- however continues to try and wander when can't remember where to be.  2. Antithrombotics: -DVT/anticoagulation:Pharmaceutical:Lovenox -antiplatelet therapy: N/A 3. Pain Management:continue oxycodone prnfor headaches 4. Mood:LCSW to follow for evaluation and support. -antipsychotic agents: continue HS seroquel to 150mg  at 2000 with 50mg  q8 hrs if needed  -continue to  avoid overstimulation  -normalizing sleep-wake cycle may be difficult given prior sleep habits  -continue propranolol 20mg  bid for mood stabilization  -ideally would benefit from ritalin for attention but hesitate given his substance abuse hx 5. Neuropsych: This patientisNOTcapable of making decisions onhisown behalf. 6. Skin/Wound Care:Routine pressure relief measures. 7. Fluids/Electrolytes/Nutrition:good po intake.  .  8. Leucocytosis: wbc 8.4     9/ nutrition- 8/31- will ask nutrition if can make double portions.  LOS: 5 days A FACE TO FACE EVALUATION WAS PERFORMED  Adreena Willits 02/10/2019, 9:21 AM

## 2019-02-10 NOTE — Progress Notes (Signed)
Physical Therapy Session Note  Patient Details  Name: Billy Malone MRN: [id number] Date of Birth: Nov 07, 1995  Today's Date: 02/10/2019 PT Individual Time: 1030-1130 PT Individual Time Calculation (min): 60 min   Short Term Goals: Week 1:  PT Short Term Goal 1 (Week 1): Pt will ambulate 150 ft with CGA with LRAD. PT Short Term Goal 2 (Week 1): Pt will negotiate 12 steps with B rails & CGA.  Skilled Therapeutic Interventions/Progress Updates:  Pt resting in bed.  He stated that he was too tired to use any exercise machines, but would like to walk.  Gait on level tile and up/down 12 steps no rails with supervision, without LOB or unsteadiness, on unit.  Pt stooped over to pick up objects off of floor repeatedly without LOB.  Pt engaged in a variety of activities with min/mod cues for attention to task.  He used re-bounder with 1 KG and 3 KG balls without dropping balls or LOB.  He stood at high table standing on [employer]ire[employer] mat for balance challenge, and completed 4 complex PVC pipe puzzles with min cues for choosing appropriately sized pieces with 80 % accuracy, and no LOB.  When PT pointed to diagram and asked leading questions, pt was able to self correct.   He played horseshoes, taking turns accurately and winning 1/2 games.    PT assisted pt to don gloves and pt cleaned a mat and table with fair accuracy.   Pt continues to confabulate about his cousin.  At end of session, pt was able to choose this PT's name with a choice of 2.  Pt left in his room with sheet across doorway to re-direct him I he attempts to leave room.     Therapy Documentation Precautions:  Precautions Precautions: None Precaution Comments: easily agitated Restrictions Weight Bearing Restrictions: No   Pain: pt denied        Therapy/Group: Individual Therapy  Athalia Setterlund 02/10/2019, 12:20 PM

## 2019-02-10 NOTE — Progress Notes (Signed)
Occupational Therapy Session Note  Patient Details  Name: Billy Malone MRN: [id number] Date of Birth: 05/29/1996  Today's Date: 02/10/2019 OT Individual Time: 0845-0955 OT Individual Time Calculation (min): 70 min    Short Term Goals: Week 1:  OT Short Term Goal 1 (Week 1): Pt will complete bathing and dressing with no more than min VC for seated positioning/leaning for safety awarness OT Short Term Goal 2 (Week 1): Pt will don shirt with set up OT Short Term Goal 3 (Week 1): Pt will initiate grooming wiht no more than min VC OT Short Term Goal 4 (Week 1): Pt will sustain attention to task in minimally distracting environment for 5 min OT Short Term Goal 5 (Week 1): Pt will be oriented to place with MAX VC and use of external aides PRN  Skilled Therapeutic Interventions/Progress Updates:    Pt received supine, watching tv with no c/o pain. Pt engaged in appropriate conversation with therapist while he ate breakfast. Pt frequently redirected to orientation, especially place, but pt was unable to carryover any information, stating he was in [address] every couple of minutes. PO intake encouraged, as pt sat up in bed. Pt completed functional mobility around room to look for a cigarette, with mod I. Pt ripped nicotine patch off his arm when he couldn't find cigarette. Pt completed functional mobility to the therapy gym with mod I, allowing redirection from therapist. Pt held 7 lb dumbbells and completed brief BUE strengthening circuit, allowing little direction from therapist on what to do. Pt also completed nustep for several minutes too before becoming frustrated with hamstring pain.  Pt then completed Wii bowling game to address turn taking, appropriate interaction with OT, and dynamic standing balance. Pt became more agitated and was redirected to return to room. Pt was left sitting up on his bed with all needs met.   Therapy Documentation Precautions:  Precautions Precautions:  None Precaution Comments: easily agitated Restrictions Weight Bearing Restrictions: No  Therapy/Group: Individual Therapy   H  02/10/2019, 7:10 AM  

## 2019-02-10 NOTE — Progress Notes (Signed)
So far in this shift, patient has come out of his room twice, once in the hallway & then again when he came down to the nurses station looking for his girlfriend. He went back to his room without being told to. No distress noted. At this time he is asleep. Will continue to monitor.
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
## 2019-02-10 NOTE — Plan of Care (Signed)
  Problem: Consults Goal: RH BRAIN INJURY PATIENT EDUCATION Description: Description: See Patient Education module for eduction specifics Outcome: Progressing Goal: Skin Care Protocol Initiated - if Braden Score 18 or less Description: If consults are not indicated, leave blank or document N/A Outcome: Progressing   Problem: RH BOWEL ELIMINATION Goal: RH STG MANAGE BOWEL WITH ASSISTANCE Description: STG Manage Bowel with mod I Assistance. Outcome: Progressing Goal: RH STG MANAGE BOWEL W/MEDICATION W/ASSISTANCE Description: STG Manage Bowel with Medication with mod I Assistance. Outcome: Progressing   Problem: RH SAFETY Goal: RH STG ADHERE TO SAFETY PRECAUTIONS W/ASSISTANCE/DEVICE Description: STG Adhere to Safety Precautions With min/mod Assistance/Device. Outcome: Progressing   Problem: RH COGNITION-NURSING Goal: RH STG USES MEMORY AIDS/STRATEGIES W/ASSIST TO PROBLEM SOLVE Description: STG Uses Memory Aids/Strategies With min/mod Assistance to Problem Solve. Outcome: Progressing   Problem: RH KNOWLEDGE DEFICIT BRAIN INJURY Goal: RH STG INCREASE KNOWLEDGE OF SELF CARE AFTER BRAIN INJURY Description: Patient/caregiver will verbalize understanding of brain injury recovery including medications, safety, stages of recovery, etc. With min/mod assist. Outcome: Progressing

## 2019-02-11 ENCOUNTER — Inpatient Hospital Stay (HOSPITAL_COMMUNITY): Payer: Self-pay

## 2019-02-11 ENCOUNTER — Inpatient Hospital Stay (HOSPITAL_COMMUNITY): Payer: Self-pay | Admitting: Physical Therapy

## 2019-02-11 ENCOUNTER — Inpatient Hospital Stay (HOSPITAL_COMMUNITY): Payer: Self-pay | Admitting: Speech Pathology

## 2019-02-11 MED ORDER — ACETAMINOPHEN 325 MG PO TABS: 325.0000 mg | ORAL_TABLET | ORAL | Status: DC | PRN

## 2019-02-11 MED ORDER — PROPRANOLOL HCL 20 MG PO TABS
20.0000 mg | ORAL_TABLET | Freq: Three times a day (TID) | ORAL | Status: DC
  Administered 2019-02-11 – 2019-02-13 (×7): 20 mg via ORAL
  Filled 2019-02-11 (×9): qty 1

## 2019-02-11 MED ORDER — LORAZEPAM 2 MG/ML IJ SOLN
INTRAMUSCULAR | Status: AC
  Administered 2019-02-11: 20:00:00 1 mg via INTRAMUSCULAR
  Filled 2019-02-11: qty 1

## 2019-02-11 MED ORDER — METHYLPHENIDATE HCL ER (OSM) 18 MG PO TBCR
18.0000 mg | EXTENDED_RELEASE_TABLET | Freq: Every day | ORAL | Status: DC
  Administered 2019-02-11 – 2019-02-13 (×2): 18 mg via ORAL
  Filled 2019-02-11 (×2): qty 1

## 2019-02-11 MED ORDER — CLONAZEPAM 0.5 MG PO TABS
1.0000 mg | ORAL_TABLET | Freq: Once | ORAL | Status: AC
  Administered 2019-02-11: 1 mg via ORAL
  Filled 2019-02-11: qty 2

## 2019-02-11 MED ORDER — QUETIAPINE FUMARATE 50 MG PO TABS
200.0000 mg | ORAL_TABLET | Freq: Every day | ORAL | Status: DC
  Administered 2019-02-11 – 2019-02-16 (×6): 200 mg via ORAL
  Filled 2019-02-11: qty 4
  Filled 2019-02-11: qty 8
  Filled 2019-02-11 (×4): qty 4

## 2019-02-11 MED ORDER — LORAZEPAM 2 MG/ML IJ SOLN
1.0000 mg | Freq: Once | INTRAMUSCULAR | Status: AC
  Administered 2019-02-11: 20:00:00 1 mg via INTRAMUSCULAR

## 2019-02-11 MED ORDER — NICOTINE 21 MG/24HR TD PT24: 21.0000 mg | MEDICATED_PATCH | Freq: Every day | TRANSDERMAL | 0 refills | Status: DC

## 2019-02-11 NOTE — Significant Event (Signed)
Patient becoming more perseverative on leaving the hospital.  He became angry about not being allowed to leave and started cussing and yelling.  Patient unable to be redirected.  Security was called to assist.  Patient attempting to climb out of window in room.  He pried the window open.  Facilities called to come bolt window shut.  Security present and patient continues to escalate.  Brita Romp, RN

## 2019-02-11 NOTE — Progress Notes (Signed)
Patient has been restless as usual throughout the day.  RN typically able to redirect patient to play cards in the dayroom when he fixates on leaving.  Mom presented to visit and patient became very agitated, becoming verbally aggressive saying he wants to punch the doctor in the face.  Bryson Ha, PT directed mother out of the room while NT and RN stayed with patient.  Patient allowed to deescalate by a calming presence, allowing him to express his feelings, and eventually changing the subject which took over an hour.  Mother asked to leave due to him becoming escalated during her visit and was provided with education.  PRN seroquel given.  Will continue to monitor.  Brita Romp, RN

## 2019-02-11 NOTE — Significant Event (Signed)
Patient is becoming more sexually inappropriate as the day goes on.  He also was noted to have a pill crusher with some white substance crushed inside.  He told the NT it was some "Perc 10s" and he wanted to share it with the RN and NT to snort.  Patient not willing to show me the pill crusher when I asked him about it.  He agreed to show me the pill crusher with the "meds" if I came back to his room with him.  NT and I followed him.  There was some questionable white substance which appeared grainy like sugar or salt.  Patient was about to snort the substance when NT wiped it off into the floor.  Patient redirected to the nurses station for closer observation.  Brita Romp, RN

## 2019-02-11 NOTE — Progress Notes (Addendum)
Physical Therapy Session Note  Patient Details  Name: Billy Malone MRN: [id number] Date of Birth: Jun 09, 1996  Today's Date: 02/11/2019 PT Individual Time: 8032-1224 PT Individual Time Calculation (min): 30 min   Short Term Goals: Week 1:  PT Short Term Goal 1 (Week 1): Pt will ambulate 150 ft with CGA with LRAD. PT Short Term Goal 2 (Week 1): Pt will negotiate 12 steps with B rails & CGA.  Skilled Therapeutic Interventions/Progress Updates:   Pt received in care of RN at nurses station. Pt w/ increased restlessness and verbal/motor perseverations, ABS score of 30 at this time. Pt perseverating on going home, had his bags in his hands. Pt easily redirected to play cards with this therapist and RN, however refused to let his belongings out of his sight. Session focused on orienting to environment and de-escalation strategies. Pt sat to play cards and then stood to play Wii w/ therapist, independent for all mobility. Pt stated his car was in the parking lot and he was at a [employer] school, disoriented to environment and situation however would occasionally state "I've been here for 2 weeks and these doctors can't tell me what to do". Pt verbalized understanding importance of staying in hospital to get better, however would continue to state he needed to see his son and would be back this evening. Pt was unable to be re-directed despite multiple attempts by this therapist and pt's RN, and when given options for going back to dayroom or going back to room. Pt w/ bags in hands and standing 20' from locked unit doors, agitation was slowly increasing. Pt was only able to be re-directed by a male staff member requesting him to stay. Pt then agreeable to go to dayroom w/ his RN. Ended session in care of RN.   Addendum:  Environmental modifications made after discussion w/ therapy and RN - doors on pt's side of dayroom to remain open while doors on far side to remain closed. This would allow pt to be in  dayroom w/o being able to see locked doors of unit. Also discussed directing pt to stay in day room or on his side of nurses station as that would also allow him to stay out of sight of locked unit doors. Also moved couch to front of dayroom TV and turned on [employer] (pt's favorite) in event he would de-escalate enough to be left in dayroom, goal to simulate home environment for relaxation. RN agreeable and will pass along information.   Therapy Documentation Precautions:  Precautions Precautions: None Precaution Comments: easily agitated Restrictions Weight Bearing Restrictions: No   Therapy/Group: Individual Therapy  Brentley Landfair Clent Demark 02/11/2019, 4:36 PM

## 2019-02-11 NOTE — Progress Notes (Signed)
At shift change, patient was trying to leave the unit with his girlfriend. He was fixated on the room & stated that it was not his room & he was going to his room over yonder( pointing to out the window). Patient could not be reasoned with. His girlfriend tried to talk to him, but he was determined to leave. His mother called & was told about the situation. 1st shift nurse & nurse techs were in his room trying to distract him, but it did not work. He was getting louder. He had packed up his stuff. While on the phone with his mother, 1st shift nurse asked her to hang up so she could call from the cell phone & she could talk to him. He cursed his mother out & was still determined to leave. His girlfriend stayed until security was present. She left & security stayed. Attempted to give him his meds & he stated that this nurse was trying to overdose him when he saw the amount of pills. He had seroquel & senna s, the seroquel was 25mg  tablets & his dose was 150mg , so he received 6 pills. He continuously stated that this nurse was trying to kill him & he was going to die. He took the meds anyway & stated, "fuck it". He tried to leave the room, but the security guards got in front of him. After a while of waiting for him to have a seat, a nurse tech was asked to sit with him until he settled down. He eventually calmed down. He did not go to sleep until around 0100. At that time, he was calm & seemed like he didn't remember any of the events of the previous hours. At this time this morning, patient is again trying to leave. The next nurse went to give his morning medication. No acute distress noted.

## 2019-02-11 NOTE — Progress Notes (Signed)
[place] PHYSICAL MEDICINE & REHABILITATION PROGRESS NOTE   Subjective/Complaints: Pt up and out of bed. Ducked under sheet attached to door to go to the dayroom  ROS: Limited due to cognitive/behavioral   Objective:   No results found. No results for input(s): WBC, HGB, HCT, PLT in the last 72 hours. No results for input(s): NA, K, CL, CO2, GLUCOSE, BUN, CREATININE, CALCIUM in the last 72 hours.  Intake/Output Summary (Last 24 hours) at 02/11/2019 0902 Last data filed at 02/11/2019 0745 Gross per 24 hour  Intake 980 ml  Output -  Net 980 ml     Physical Exam: Vital Signs Blood pressure 115/78, pulse (!) 51, temperature 98.3 F (36.8 C), resp. rate 17, height 6' (1.829 m), SpO2 100 %.  Constitutional: No distress . Vital signs reviewed. HEENT: EOMI, oral membranes moist Neck: supple Cardiovascular: RRR without murmur. No JVD    Respiratory: CTA Bilaterally without wheezes or rales. Normal effort    GI: BS +, non-tender, non-distended  Extremities: No clubbing, cyanosis, or edema. Pulses are 2+ Skin: Clean and intact without signs of breakdown. Numerous tats on all 4's and face Neuro: awake and alert. Still with limited insight.  Cranial nerves 2-12 are grossly intact. Sensory exam is normal. Reflexes are 2+ in all 4's. No tremors. Motor function is grossly 4-5/5.  Musculoskeletal: Full ROM, No pain with AROM or PROM in the neck, trunk, or extremities.  Psych: disinhibited      Assessment/Plan: 1. Functional deficits secondary to TBI which require 3+ hours per day of interdisciplinary therapy in a comprehensive inpatient rehab setting.  Physiatrist is providing close team supervision and 24 hour management of active medical problems listed below.  Physiatrist and rehab team continue to assess barriers to discharge/monitor patient progress toward functional and medical goals  Care Tool:  Bathing  Bathing activity did not occur: Refused Body parts bathed by patient:  Right arm, Left upper leg, Right lower leg, Left arm, Chest, Front perineal area, Left lower leg, Abdomen, Face, Buttocks, Right upper leg   Body parts bathed by helper: Right arm, Chest, Left arm, Abdomen, Front perineal area, Buttocks, Right upper leg, Left upper leg, Right lower leg, Left lower leg, Face     Bathing assist Assist Level: Supervision/Verbal cueing     Upper Body Dressing/Undressing Upper body dressing   What is the patient wearing?: Pull over shirt    Upper body assist Assist Level: Independent    Lower Body Dressing/Undressing Lower body dressing      What is the patient wearing?: Pants     Lower body assist Assist for lower body dressing: Independent     Toileting Toileting    Toileting assist Assist for toileting: Independent     Transfers Chair/bed transfer  Transfers assist     Chair/bed transfer assist level: Supervision/Verbal cueing     Locomotion Ambulation   Ambulation assist      Assist level: Supervision/Verbal cueing Assistive device: No Device Max distance: 150   Walk 10 feet activity   Assist     Assist level: Supervision/Verbal cueing Assistive device: No Device   Walk 50 feet activity   Assist    Assist level: Supervision/Verbal cueing Assistive device: No Device    Walk 150 feet activity   Assist Walk 150 feet activity did not occur: Safety/medical concerns(agitation)  Assist level: Supervision/Verbal cueing Assistive device: No Device    Walk 10 feet on uneven surface  activity   Assist Walk 10 feet  on uneven surfaces activity did not occur: Safety/medical concerns(agitation)         Wheelchair     Assist Will patient use wheelchair at discharge?: No             Wheelchair 50 feet with 2 turns activity    Assist            Wheelchair 150 feet activity     Assist          Blood pressure 115/78, pulse (!) 51, temperature 98.3 F (36.8 C), resp. rate 17, height  6' (1.829 m), SpO2 100 %.  Medical Problem List and Plan: 1.Imapired function/ADLs/mobility/cognitionsecondary toTBI, depressed skull fx with [employer] Level V+  -Continue CIR therapies today including PT, SLP and OT  -team conference today 2. Antithrombotics: -DVT/anticoagulation:Pharmaceutical:Lovenox -antiplatelet therapy: N/A 3. Pain Management:continue oxycodone prnfor headaches 4. Mood:LCSW to follow for evaluation and support. -antipsychotic agents: continue HS seroquel to 150mg  at 2000 with 50mg  q8 hrs if needed  -environmental mod  -normalizing sleep-wake cycle may be difficult given prior sleep habits  -continue propranolol 20mg  bid for mood stabilization  -ideally would benefit from ritalin for attention but hesitate given his substance abuse hx 5. Neuropsych: This patientisNOTcapable of making decisions onhisown behalf. 6. Skin/Wound Care:Routine pressure relief measures. 7. Fluids/Electrolytes/Nutrition:good po intake.  .   -double portions requested 8. Leucocytosis: wbc 8.4       LOS: 6 days A FACE TO FACE EVALUATION WAS PERFORMED  Meredith Staggers 02/11/2019, 9:02 AM

## 2019-02-11 NOTE — Progress Notes (Signed)
Occupational Therapy Session Note  Patient Details  Name: Billy Malone MRN: [id number] Date of Birth: 08/18/1995  Today's Date: 02/11/2019 OT Individual Time: 1100-1200 OT Individual Time Calculation (min): 60 min    Short Term Goals: Week 1:  OT Short Term Goal 1 (Week 1): Pt will complete bathing and dressing with no more than min VC for seated positioning/leaning for safety awarness OT Short Term Goal 2 (Week 1): Pt will don shirt with set up OT Short Term Goal 3 (Week 1): Pt will initiate grooming wiht no more than min VC OT Short Term Goal 4 (Week 1): Pt will sustain attention to task in minimally distracting environment for 5 min OT Short Term Goal 5 (Week 1): Pt will be oriented to place with MAX VC and use of external aides PRN  Skilled Therapeutic Interventions/Progress Updates:    Pt received supine with no c/o pain. Pt occasionally grimacing in pain when sitting up, c/o pain in his hamstrings- requesting no intervention other than rest. Pt engaged in [place] game at bed level, sitting up as he became more engaged. Throughout game pt interacted appropriately with therapist and took turns appropriately. Pt demonstrated improved reasoning skills, as he completed basic computations and was able to pick up on concepts of game. Pt spoke with student OT regarding family members in pictures, demonstration appropriate social interaction. Pt became more excitable as session progressed and as he looked through his bag for items. Pt prepared items for shower to be taken at later time. Pt was left supine with all needs met.   Of note: pt had pill crusher with pill crushed in it that he refused to give staff, claiming he would "snort it later on".   Therapy Documentation Precautions:  Precautions Precautions: None Precaution Comments: easily agitated Restrictions Weight Bearing Restrictions: No   Therapy/Group: Individual Therapy  Curtis Sites 02/11/2019, 7:20 AM

## 2019-02-11 NOTE — Plan of Care (Signed)
  Problem: Consults Goal: Skin Care Protocol Initiated - if Braden Score 18 or less Description: If consults are not indicated, leave blank or document N/A Outcome: Progressing   Problem: RH BOWEL ELIMINATION Goal: RH STG MANAGE BOWEL WITH ASSISTANCE Description: STG Manage Bowel with mod I Assistance. Outcome: Progressing Goal: RH STG MANAGE BOWEL W/MEDICATION W/ASSISTANCE Description: STG Manage Bowel with Medication with mod I Assistance. Outcome: Progressing   Problem: RH SAFETY Goal: RH STG ADHERE TO SAFETY PRECAUTIONS W/ASSISTANCE/DEVICE Description: STG Adhere to Safety Precautions With min/mod Assistance/Device. Outcome: Not Progressing   Problem: RH COGNITION-NURSING Goal: RH STG USES MEMORY AIDS/STRATEGIES W/ASSIST TO PROBLEM SOLVE Description: STG Uses Memory Aids/Strategies With min/mod Assistance to Problem Solve. Outcome: Not Progressing   Problem: RH KNOWLEDGE DEFICIT BRAIN INJURY Goal: RH STG INCREASE KNOWLEDGE OF SELF CARE AFTER BRAIN INJURY Description: Patient/caregiver will verbalize understanding of brain injury recovery including medications, safety, stages of recovery, etc. With min/mod assist. Outcome: Not Progressing

## 2019-02-11 NOTE — Progress Notes (Signed)
Speech Language Pathology Daily Session Note  Patient Details  Name: Billy Malone MRN: [id number] Date of Birth: Jun 29, 1995  Today's Date: 02/11/2019 SLP Individual Time: 0715-0815 SLP Individual Time Calculation (min): 60 min  Short Term Goals: Week 1: SLP Short Term Goal 1 (Week 1): Patient will utilize external aids for orientation to place, time and situation with Mod A verbal and visual cues. SLP Short Term Goal 2 (Week 1): Patient will demonstrate sustained attention to functional tasks for ~30 minutes with Mod verbal cues for redirection. SLP Short Term Goal 3 (Week 1): Patient will demonstrate basic problem solving with familiar tasks with Mod verbal cues. SLP Short Term Goal 4 (Week 1): Patient will identify 1 physical and 1 cognitive deficit with Max multimodal cues.  Skilled Therapeutic Interventions: Skilled treatment session focused on cognitive goals. SLP facilitated session by providing Min A verbal cues for basic problem solving while sorting items from a field of 6 and 8. SLP also facilitated session by providing Mod-Max A verbal cues for recall of procedures to a novel task but sustained attention to task for ~25 minutes with overall Min A verbal cues. Patient continues to demonstrate language of confusion with paraphasias. However, awareness of verbal errors is improving with ability to independently self-correct. Patient requires constant re-orientation throughout session as well. Patient left upright sitting EOB with all needs within reach. Continue with current plan of care.      Pain No/Denies Pain   Therapy/Group: Individual Therapy  [name] 02/11/2019, 8:17 AM

## 2019-02-11 NOTE — Progress Notes (Signed)
Physical Therapy Session Note  Patient Details  Name: Billy Malone MRN: [id number] Date of Birth: 02-04-1996  Today's Date: 02/11/2019 PT Individual Time: 0915-1000 PT Individual Time Calculation (min): 45 min   Short Term Goals: Week 1:  PT Short Term Goal 1 (Week 1): Pt will ambulate 150 ft with CGA with LRAD. PT Short Term Goal 2 (Week 1): Pt will negotiate 12 steps with B rails & CGA.  Skilled Therapeutic Interventions/Progress Updates:    Pt received seated in chair at nurse's station, agreeable to therapy session. Pt is independent for all transfers and mobility this session. Session focus on attention to task, dynamic standing balance, turn taking, focus on slow and purposeful movements, etc while engaging in several Wii Sports games including bowling and boxing. Pt requires min to mod cueing throughout session to attend to sports games and Supervision to CGA at times for dynamic standing balance. Pt is able to attend to 2 bowling games and one boxing match with fair ability to perform slow and purposeful setup for bowling. Pt reports urge to urinate, pt is Supervision to independent for toileting. Pt left in room at end of session with sheet across the door to encourage pt to stay in room. Pt found a few minutes later standing at the nurse's station asking for his cell phone. Pt left in care of charge RN.  Therapy Documentation Precautions:  Precautions Precautions: None Precaution Comments: easily agitated Restrictions Weight Bearing Restrictions: No    Therapy/Group: Individual Therapy   Excell Seltzer, PT, DPT  02/11/2019, 12:53 PM

## 2019-02-11 NOTE — Progress Notes (Signed)
Physical Therapy Note  Patient Details  Name: Sonnie Pawloski MRN: [id number] Date of Birth: Oct 21, 1995 Today's Date: 02/11/2019    Pt missed 60 min of skilled PT due to increased agitated state (scored 44 on Agitated Behavior Scale) and unable to be redirected. Pt's mom and NT present in the room. Notified RN about pt's nicotine patch being off and perseverating on wanting a cigarette and to get out of here. Pt packing bags and starting to yell about leaving and being upset with MDs. This PT took mom out of the room to help defuse the situation while NT and RN in room with patient. Education and emotional support provided to patient's mom in separate room about stages of recovery, cognitive state, and agitation. She was very receptive to education and also wanted to speak with MD. MD also joined and participated in education and reinforcement of TBI education and current status, goals, and medication management. Discussed initiating formal behavior plan with primary team.  Juanna Cao, PT, DPT, CBIS  02/11/2019, 3:04 PM

## 2019-02-12 ENCOUNTER — Inpatient Hospital Stay (HOSPITAL_COMMUNITY): Payer: Self-pay | Admitting: *Deleted

## 2019-02-12 ENCOUNTER — Inpatient Hospital Stay (HOSPITAL_COMMUNITY): Payer: Self-pay

## 2019-02-12 ENCOUNTER — Inpatient Hospital Stay (HOSPITAL_COMMUNITY): Payer: Self-pay | Admitting: Speech Pathology

## 2019-02-12 ENCOUNTER — Encounter (HOSPITAL_COMMUNITY): Payer: Self-pay | Admitting: Psychology

## 2019-02-12 DIAGNOSIS — S069X9D Unspecified intracranial injury with loss of consciousness of unspecified duration, subsequent encounter: Secondary | ICD-10-CM

## 2019-02-12 DIAGNOSIS — S069X1D Unspecified intracranial injury with loss of consciousness of 30 minutes or less, subsequent encounter: Secondary | ICD-10-CM

## 2019-02-12 LAB — BASIC METABOLIC PANEL
Anion gap: 9 (ref 5–15)
BUN: 14 mg/dL (ref 6–20)
CO2: 28 mmol/L (ref 22–32)
Calcium: 9.5 mg/dL (ref 8.9–10.3)
Chloride: 104 mmol/L (ref 98–111)
Creatinine, Ser: 1.13 mg/dL (ref 0.61–1.24)
GFR calc Af Amer: >60 mL/min (ref >60)
GFR calc non Af Amer: >60 mL/min (ref >60)
Glucose, Bld: 108 mg/dL — ABNORMAL HIGH (ref 70–99)
Potassium: 4.1 mmol/L (ref 3.5–5.1)
Sodium: 141 mmol/L (ref 135–145)

## 2019-02-12 MED ORDER — NICOTINE POLACRILEX 2 MG MT GUM
2.0000 mg | CHEWING_GUM | OROMUCOSAL | Status: DC | PRN
  Administered 2019-02-12 – 2019-02-17 (×14): 2 mg via ORAL
  Filled 2019-02-12 (×21): qty 1

## 2019-02-12 MED ORDER — CLONAZEPAM 0.5 MG PO TABS
0.5000 mg | ORAL_TABLET | Freq: Three times a day (TID) | ORAL | Status: DC | PRN
  Administered 2019-02-12: 0.5 mg via ORAL
  Filled 2019-02-12: qty 1

## 2019-02-12 NOTE — Plan of Care (Signed)
  Problem: Consults Goal: RH BRAIN INJURY PATIENT EDUCATION Description: Description: See Patient Education module for eduction specifics Outcome: Progressing Goal: Skin Care Protocol Initiated - if Braden Score 18 or less Description: If consults are not indicated, leave blank or document N/A Outcome: Progressing   Problem: RH BOWEL ELIMINATION Goal: RH STG MANAGE BOWEL WITH ASSISTANCE Description: STG Manage Bowel with mod I Assistance. Outcome: Progressing Goal: RH STG MANAGE BOWEL W/MEDICATION W/ASSISTANCE Description: STG Manage Bowel with Medication with mod I Assistance. Outcome: Progressing   Problem: RH SAFETY Goal: RH STG ADHERE TO SAFETY PRECAUTIONS W/ASSISTANCE/DEVICE Description: STG Adhere to Safety Precautions With min/mod Assistance/Device. Outcome: Not Progressing   Problem: RH COGNITION-NURSING Goal: RH STG USES MEMORY AIDS/STRATEGIES W/ASSIST TO PROBLEM SOLVE Description: STG Uses Memory Aids/Strategies With min/mod Assistance to Problem Solve. Outcome: Not Progressing   Problem: RH KNOWLEDGE DEFICIT BRAIN INJURY Goal: RH STG INCREASE KNOWLEDGE OF SELF CARE AFTER BRAIN INJURY Description: Patient/caregiver will verbalize understanding of brain injury recovery including medications, safety, stages of recovery, etc. With min/mod assist. Outcome: Not Progressing    Patient has no awareness of his deficits, therefore refuses teaching.

## 2019-02-12 NOTE — Significant Event (Signed)
Patient continuing to exhibit unsafe behaviors and is both verbally and physically aggressive, even with security present.  Marlowe Sh[place], PA paged.  Ordered one time dose of 1mg  Ativan IM now.  Patient allowed nurse to give him his shot in his arm without problems.  Patient left with security just outside door.  Report given to oncoming shift.  Brita Romp, RN

## 2019-02-12 NOTE — Progress Notes (Signed)
Patient has not had any behavioral outbursts throughout the day.  He has NOT required any PRN medications.  Patient has not come out of his room for the majority of the day without staff present (I.e. for therapy).  He has not mentioned leaving the hospital today.  Patient has napped on and off.   Brita Romp, RN

## 2019-02-12 NOTE — Progress Notes (Signed)
Patient slept the majority of the morning, waking around 11 am.  He has been pleasant, calm, easily redirectable thus far.  Held morning medications so he could sleep.  Discussed holding dose of Concerta today given late admin time with PA.  Spoke with Pam about to nicotine gum since patient will not keep his nicotine patch in place.  Patient resting in his room, no complaints at this time.  Brita Romp, RN

## 2019-02-12 NOTE — Progress Notes (Signed)
Speech Language Pathology Daily Session Note  Patient Details  Name: Terryn Redner MRN: [id number] Date of Birth: [birth year]  Today's Date: 02/12/2019 SLP Individual Time: 1215-1300 SLP Individual Time Calculation (min): 45 min and Today's Date: 02/12/2019 SLP Missed Time: 15 Minutes Missed Time Reason: Patient fatigue  Short Term Goals: Week 1: SLP Short Term Goal 1 (Week 1): Patient will utilize external aids for orientation to place, time and situation with Mod A verbal and visual cues. SLP Short Term Goal 2 (Week 1): Patient will demonstrate sustained attention to functional tasks for ~30 minutes with Mod verbal cues for redirection. SLP Short Term Goal 3 (Week 1): Patient will demonstrate basic problem solving with familiar tasks with Mod verbal cues. SLP Short Term Goal 4 (Week 1): Patient will identify 1 physical and 1 cognitive deficit with Max multimodal cues.  Skilled Therapeutic Interventions: Skilled treatment session focused on cognitive goals. SLP facilitated session by providing Min A verbal cues for complex problem solving during a novel card task. Patient able to carryover rules of task throughout the game and over an ~30 minute period. Patient also remained calm and cooperative during lab draws with SLP present. Overall, patient with decreased verbosity and inappropriate language and increased attention to tasks. Patient left upright in bed with lunch present. Continue with current plan of care.      Pain No/Denies Pain   Therapy/Group: Individual Therapy  Crystie Yanko 02/12/2019, 1:15 PM
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
## 2019-02-12 NOTE — Progress Notes (Signed)
Social Work Patient ID: Billy Malone, male   DOB: Dec 09, 1995, 23 y.o.   MRN: [id number]   Met with pt's mother yesterday afternoon and she is aware of targeted d/c date of 9/8 and supervision goals.  She was very stressed and wringing hands after visiting with pt who became very agitated with her.  Pt evaluated by neuropsychology this afternoon as well.  Behavior treatment plan established by therapies and nursing, however, keeping pt here until targeted dc date may be difficult.  Will discuss further with team and MD tomorrow morning.  Adelis Docter, LCSW
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
## 2019-02-12 NOTE — Progress Notes (Signed)
Physical Therapy Note  Patient Details  Name: Billy Malone MRN: [id number] Date of Birth: 08-01-1995 Today's Date: 02/12/2019    Per discussion with Loree Fee, RN, patient currently is asleep after a restless night. Patient will benefit from continued sleep at this time. Patient missed 45 min of skilled PT due to patient fatigue/sleeping. Will attempt to make up missed time as able.    Lollie Gunner L Jeannett Dekoning PT, DPT  02/12/2019, 8:20 AM

## 2019-02-12 NOTE — Progress Notes (Signed)
Occupational Therapy Session Note  Patient Details  Name: Billy Malone MRN: [id number] Date of Birth: 27-Aug-1995  Today's Date: 02/12/2019 OT Individual Time: 1100-1155 OT Individual Time Calculation (min): 55 min   Session 2: OT Individual Time: 1420-1500 OT Individual Time Calculation (min): 40 min    Short Term Goals: Week 1:  OT Short Term Goal 1 (Week 1): Pt will complete bathing and dressing with no more than min VC for seated positioning/leaning for safety awarness OT Short Term Goal 2 (Week 1): Pt will don shirt with set up OT Short Term Goal 3 (Week 1): Pt will initiate grooming wiht no more than min VC OT Short Term Goal 4 (Week 1): Pt will sustain attention to task in minimally distracting environment for 5 min OT Short Term Goal 5 (Week 1): Pt will be oriented to place with MAX VC and use of external aides PRN  Skilled Therapeutic Interventions/Progress Updates:    Pt received supine, just awakening. Pt's breakfast was heated up for him and he was set up to eat, sitting up in bed. Pt had appropriate interaction with therapist throughout eating. He stayed on topics initiated by therapist and made no inappropriate comments or behaviors. Pt finished eating breakfast and became perseverative on needing cigarette. Pt walked around in hallway and asked another staff member for a cigarette. When told no, pt able to be redirected back to room. Pt gathered up clothes in his room and he was guided to the laundry room. Pt loaded clothes into the washer and was able to direct care. Pt returned to his room and was left with all needs met, curtain hanging up over door for visual cue.   Session 2:  Pt received supine with no c/o pain. Pt engaged in task using the computer to look up rare and collectible coins (prior interest of his). He appropriately used the computer and interacted with OT socially without any inappropriate comments or increased levels of agitation. Pt allowed therapist  to direct task and change topic. Pt then was directed down to laundry room where he transferred clothes from the washer to the dryer. Pt returned to his room without cueing and was able to identify his room based on sheet hanging on door. Pt was left supine in bed with all needs met.   Therapy Documentation Precautions:  Precautions Precautions: None Precaution Comments: easily agitated Restrictions Weight Bearing Restrictions: No   Therapy/Group: Individual Therapy  Curtis Sites 02/12/2019, 7:09 AM

## 2019-02-12 NOTE — Plan of Care (Signed)
Behavior Plan for W07  Behavior to decrease/eliminate:  . Wandering  . Agitation with staff  . Inappropriate interactions with staff . Wanting/looking for cigarette   Interventions: Redirect to room, engage in one of the following preferred tasks  Pt interests:  . Cooking shows  . Coins (collects) . Organizing/ "helping" staff  . Rap music . Board games + card games  **Patients mom is available via phone first if he asks or needs reassurance (especially about his kids). If not suffice, she is allowed back to visit and help de-escalate behaviors. She is ok with [place] telling her leave (not in front of patient) at any time.   Immediate Intervention: Everyone interacting with Billy Malone should try and respond to his behavior in the same manner so pt has the opportunity to learn. Your responses should include:   Additional strategies to prevent agitation: . Reducing family visits . Pt can move throughout his room independently, with white sheet placed over door as visual cue . Every staff member needs to fill out the Agitated Behavior Scale . Keep nicotine patch on . Engage in task where patient is "helping" when appropriate, i.e. organizing, putting away  . Pt to stay inside of egress doors for all therapies

## 2019-02-13 ENCOUNTER — Inpatient Hospital Stay (HOSPITAL_COMMUNITY): Payer: Self-pay | Admitting: Rehabilitation

## 2019-02-13 ENCOUNTER — Inpatient Hospital Stay (HOSPITAL_COMMUNITY): Payer: Self-pay

## 2019-02-13 ENCOUNTER — Inpatient Hospital Stay (HOSPITAL_COMMUNITY): Payer: Self-pay | Admitting: Speech Pathology

## 2019-02-13 MED ORDER — LORAZEPAM 1 MG PO TABS
1.0000 mg | ORAL_TABLET | ORAL | Status: AC
  Administered 2019-02-13: 1 mg via ORAL
  Filled 2019-02-13: qty 1

## 2019-02-13 MED ORDER — CLONAZEPAM 0.5 MG PO TABS
1.0000 mg | ORAL_TABLET | Freq: Every day | ORAL | Status: DC
  Administered 2019-02-14 – 2019-02-17 (×4): 1 mg via ORAL
  Filled 2019-02-13 (×4): qty 2

## 2019-02-13 MED ORDER — LORAZEPAM 2 MG/ML IJ SOLN
INTRAMUSCULAR | Status: AC
  Filled 2019-02-13: qty 1

## 2019-02-13 MED ORDER — CLONAZEPAM 0.5 MG PO TABS
1.0000 mg | ORAL_TABLET | Freq: Three times a day (TID) | ORAL | Status: DC | PRN
  Administered 2019-02-13 – 2019-02-16 (×3): 1 mg via ORAL
  Filled 2019-02-13 (×6): qty 2

## 2019-02-13 MED ORDER — LORAZEPAM 1 MG PO TABS: 1.0000 mg | ORAL_TABLET | Freq: Once | ORAL | Status: AC

## 2019-02-13 MED ORDER — LORAZEPAM 2 MG/ML IJ SOLN
1.0000 mg | Freq: Once | INTRAMUSCULAR | Status: AC
  Administered 2019-02-13: 1 mg via INTRAMUSCULAR

## 2019-02-13 MED ORDER — LORAZEPAM 2 MG/ML IJ SOLN
1.0000 mg | INTRAMUSCULAR | Status: AC
  Filled 2019-02-13: qty 1

## 2019-02-13 MED ORDER — METHYLPHENIDATE HCL ER (OSM) 18 MG PO TBCR
18.0000 mg | EXTENDED_RELEASE_TABLET | Freq: Every day | ORAL | Status: DC
  Administered 2019-02-14 – 2019-02-16 (×3): 18 mg via ORAL
  Filled 2019-02-13 (×5): qty 1

## 2019-02-13 NOTE — Progress Notes (Signed)
First part of shift, calm and cooperative. Scheduled meds given, refused Senna S at 1953. At 2130, patient packed all his belongings & set them outside of his door. Became agitated, adamant about going home to get stuff for son. Required 1:1 supervision until 2400 with bouts of calmness and  Agitation. "people are hiding out in my bathroom." Sexually inappropriate at times. Asking for cigarettes, refused nicotine gum. PRN klonipin given at 2156 and PRN seroquel given at 2312. In room sleeping/resting since 2400. Billy Malone A
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
## 2019-02-13 NOTE — Progress Notes (Signed)
Physical Therapy Weekly Progress Note  Patient Details  Name: Billy Malone MRN: [id number] Date of Birth: 09/10/1995  Beginning of progress report period: February 06, 2019 End of progress report period: February 13, 2019  Today's Date: 02/13/2019 PT Individual Time: 1040-1130 PT Individual Time Calculation (min): 50 min  and Today's Date: 02/13/2019 PT Missed Time: 25 Minutes Missed Time Reason: Patient fatigue(sleeping)  Patient has met 2 of 2 short term goals.  Pt making excellent progress from a mobility standpoint.  He continues to have fluctuating behavior, however this morning, per nursing and staff (and PT) is more agreeable to requests made by staff.  He did not become verbally or physically agitated during PT session.  He was appropriately excited when playing game of cards with PT.  Also engaged in looking through coins to look for certain dates.     Patient continues to demonstrate the following deficits decreased attention, decreased awareness, decreased problem solving, decreased safety awareness and decreased memory and therefore will continue to benefit from skilled PT intervention to increase functional independence with mobility.  Patient progressing toward long term goals..  Continue plan of care.  PT Short Term Goals Week 1:  PT Short Term Goal 1 (Week 1): Pt will ambulate 150 ft with CGA with LRAD. PT Short Term Goal 1 - Progress (Week 1): Met PT Short Term Goal 2 (Week 1): Pt will negotiate 12 steps with B rails & CGA. PT Short Term Goal 2 - Progress (Week 1): Met Week 2:  PT Short Term Goal 1 (Week 2): =LTGs due to ELOS  Skilled Therapeutic Interventions/Progress Updates:   PT initially entering room at 10:15, however pt wanting to sleep a little longer.  PT reports she would return in 30 mins and check on patient.  Pt agreeable to this plan.  Upon PT returning, he initially still reports fatigue and wanting to "just nap until he leaves in 20 mins."  PT engaging  in conversation about returning home to son and that he enjoys cooking and son does help sometimes.  PT encouraged looking up recipe for no-bake cookies, which pt was agreeable to.  PT looked up chocolate-peanut butter cookie recipe.  Pt unwilling to write ingredients down, therefore PT had pt call out ingredients to PT so that she could write them.  PT also had to write instruction as pt just said "you can screen shot that."  Pt becoming more willing to engage, therefore PT offered to play card game.  Pt shuffled and dealt cards during session, somewhat explaining rules and stating we were playing "five card draw."  He was unable to explain or follow set rules during game, despite PT looking up rules and attempting to go by them.  Pt did become very excited when playing cards and "beating" PT.  Pt then up moving around room and leaving room with PT.  Pt wanted to go to nursing station and ask for cigarettes.  PT able to guide pt through day room and back to room.  Once back in room, pt looking at container of play money.  He began to call out dates on them.  PT asking to to look for 1985 and 1997 (dates of birth).  Pt did need max fading to min cues to recall date.  Pt left in doorway with nurse tech providing supervision to ensure he doesn't leave room.    Therapy Documentation Precautions:  Precautions Precautions: None Precaution Comments: easily agitated Restrictions Weight Bearing Restrictions: No General:  PT Amount of Missed Time (min): 25 Minutes PT Missed Treatment Reason: Patient fatigue(sleeping)   Pain: Pt reports no pain during session.    Therapy/Group: Individual Therapy  Denice Bors 02/13/2019, 12:34 PM
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
## 2019-02-13 NOTE — Progress Notes (Signed)
Occupational Therapy Session Note  Patient Details  Name: Billy Malone MRN: [id number] Date of Birth: [birth year]  Today's Date: 02/13/2019 OT Individual Time: 9326-7124 OT Individual Time Calculation (min): 15 min    Short Term Goals: Week 1:  OT Short Term Goal 1 (Week 1): Pt will complete bathing and dressing with no more than min VC for seated positioning/leaning for safety awarness OT Short Term Goal 2 (Week 1): Pt will don shirt with set up OT Short Term Goal 3 (Week 1): Pt will initiate grooming wiht no more than min VC OT Short Term Goal 4 (Week 1): Pt will sustain attention to task in minimally distracting environment for 5 min OT Short Term Goal 5 (Week 1): Pt will be oriented to place with MAX VC and use of external aides PRN  Skilled Therapeutic Interventions/Progress Updates:    1:1. Pt received in bed asleep, requiring increased time to arouse. Pt politely declining all intervention d/t fatigue. MD reporting new medication started may be impacting arousal. Pt educated on importance of participating in tx and given options in room/out of room, however pt still declining. Attempted to follow up 30 min later to participate in tx, however pt continues to refuse d/t fatigue. Exited session with pt in bed, exit alarm on and call light in reach  Therapy Documentation Precautions:  Precautions Precautions: None Precaution Comments: easily agitated Restrictions Weight Bearing Restrictions: No General: General OT Amount of Missed Time: 60 Minutes Vital Signs:  Pain:   ADL: ADL Grooming: Unable to assess, Other (comment)(pt refused) Upper Body Bathing: Unable to assess(refused) Lower Body Bathing: Unable to assess(refused) Upper Body Dressing: Contact guard Where Assessed-Upper Body Dressing: Standing at sink Lower Body Dressing: Minimal assistance Where Assessed-Lower Body Dressing: Standing at sink Toileting: Minimal assistance(stnading) Where Assessed-Toileting:  Toilet(standing) Tub/Shower Transfer: Minimal assistance Tub/Shower Transfer Method: Ambulating Tub/Shower Equipment: Landscape architect   Exercises:   Other Treatments:     Therapy/Group: Individual Therapy  Tonny Branch 02/13/2019, 12:28 PM

## 2019-02-14 ENCOUNTER — Inpatient Hospital Stay (HOSPITAL_COMMUNITY): Payer: Self-pay | Admitting: Speech Pathology

## 2019-02-14 ENCOUNTER — Inpatient Hospital Stay (HOSPITAL_COMMUNITY): Payer: Self-pay | Admitting: Physical Therapy

## 2019-02-14 ENCOUNTER — Inpatient Hospital Stay (HOSPITAL_COMMUNITY): Payer: Self-pay

## 2019-02-14 MED ORDER — DIVALPROEX SODIUM ER 500 MG PO TB24
500.0000 mg | ORAL_TABLET | Freq: Every day | ORAL | Status: DC
  Administered 2019-02-14 – 2019-02-16 (×3): 500 mg via ORAL
  Filled 2019-02-14 (×4): qty 1

## 2019-02-14 MED ORDER — PROPRANOLOL HCL ER 80 MG PO CP24
80.0000 mg | ORAL_CAPSULE | Freq: Every day | ORAL | Status: DC
  Administered 2019-02-14 – 2019-02-18 (×5): 80 mg via ORAL
  Filled 2019-02-14 (×5): qty 1
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
## 2019-02-14 NOTE — Plan of Care (Signed)
  Problem: RH Cognition - SLP Goal: RH LTG Patient will demonstrate orientation with cues Description:  LTG:  Patient will demonstrate orientation to person/place/time/situation with cues (SLP)   Flowsheets (Taken 02/14/2019 0625) LTG: Patient will demonstrate orientation using cueing (SLP): Maximal Assistance - Patient 25 - 49% Note: Downgraded due to lack of progress, 02/14/19   Problem: RH Problem Solving Goal: LTG Patient will demonstrate problem solving for (SLP) Description: LTG:  Patient will demonstrate problem solving for basic/complex daily situations with cues  (SLP) Flowsheets (Taken 02/14/2019 0625) LTG: Patient will demonstrate problem solving for (SLP): Basic daily situations Note: Downgraded due to lack of progress, 02/14/19   Problem: RH Memory Goal: LTG Patient will use memory compensatory aids to (SLP) Description: LTG:  Patient will use memory compensatory aids to recall biographical/new, daily complex information with cues (SLP) Flowsheets (Taken 02/14/2019 0625) LTG: Patient will use memory compensatory aids to (SLP): Maximal Assistance - Patient 25 - 49% Note: Downgraded due to lack of progress, 02/14/19   Problem: RH Attention Goal: LTG Patient will demonstrate this level of attention during functional activites (SLP) Description: LTG:  Patient will will demonstrate this level of attention during functional activites (SLP) Flowsheets (Taken 02/14/2019 1610) LTG: Patient will demonstrate this level of attention during cognitive/linguistic activities with assistance of (SLP): Moderate Assistance - Patient 50 - 74% Note: Downgraded due to lack of progress, 02/14/19   Problem: RH Awareness Goal: LTG: Patient will demonstrate awareness during functional activites type of (SLP) Description: LTG: Patient will demonstrate awareness during functional activites type of (SLP) Flowsheets (Taken 02/14/2019 9604) LTG: Patient will demonstrate awareness during cognitive/linguistic activities  with assistance of (SLP): Maximal Assistance - Patient 25 - 49% Note: Downgraded due to lack of progress, 02/14/19

## 2019-02-14 NOTE — Progress Notes (Signed)
Speech Language Pathology Daily Session Note  Patient Details  Name: Billy Malone MRN: [id number] Date of Birth: 1996-03-30  Today's Date: 02/14/2019 SLP Individual Time: 1300-1405 SLP Individual Time Calculation (min): 65 min  Short Term Goals: Week 2: SLP Short Term Goal 1 (Week 2): STGs=LTGs due to ELOS  Skilled Therapeutic Interventions: Skilled treatment session focused on cognitive goals. Upon arrival, patient was looking out the window and reported he was waiting for "his baby momma," however, patient agreeable to participate in treatment session while "waiting." SLP facilitated session by providing Max A verbal cues for orientation to date and supervision verbal cues for problem solving during a basic calendar making task. SLP also facilitated session by providing Min A verbal cues for sustained attention for ~45 minutes during a novel card task, however, patient required Max verbal cues to recall procedures to task with each new game. Patient continues to demonstrate language of confusion but was calm and cooperative throughout session. Patient left upright in chair with all needs within reach. Continue with current plan of care.      Pain No/Denies Pain   Therapy/Group: Individual Therapy  Jeannifer Drakeford 02/14/2019, 3:22 PM

## 2019-02-14 NOTE — Progress Notes (Signed)
Occupational Therapy Weekly Progress Note  Patient Details  Name: Billy Malone MRN: [id number] Date of Birth: Nov 28, 1995  Beginning of progress report period: February 06, 2019 End of progress report period: February 14, 2019  Today's Date: 02/14/2019 OT Individual Time: 3007-6226 OT Individual Time Calculation (min): 60 min    Patient has met 3 of 4 short term goals.  Pt has made steady progress this week improving to an overall distant supervision level for mobility/ADL performance, however participation has been limited by behavior and sleep. Pt has improved to be pleasant throughout session, more cooperative and less verbally and sexually inappropriate in the majority of interactions with OT staff. Pt continues to be disoriented to place and situation with occasional insight to TBI.  Patient continues to demonstrate the following deficits: muscle weakness, decreased cardiorespiratoy endurance, decreased attention, decreased awareness, decreased problem solving, decreased safety awareness and decreased memory and decreased balance strategies and therefore will continue to benefit from skilled OT intervention to enhance overall performance with BADL and iADL.  Patient progressing toward long term goals..  Continue plan of care.  OT Short Term Goals Week 1:  OT Short Term Goal 1 (Week 1): Pt will complete bathing and dressing with no more than min VC for seated positioning/leaning for safety awarness OT Short Term Goal 1 - Progress (Week 1): Met OT Short Term Goal 2 (Week 1): Pt will don shirt with set up OT Short Term Goal 2 - Progress (Week 1): Met OT Short Term Goal 3 (Week 1): Pt will initiate grooming wiht no more than min VC OT Short Term Goal 3 - Progress (Week 1): Met OT Short Term Goal 4 (Week 1): Pt will sustain attention to task in minimally distracting environment for 5 min OT Short Term Goal 4 - Progress (Week 1): Met OT Short Term Goal 5 (Week 1): Pt will be oriented to  place with MAX VC and use of external aides PRN OT Short Term Goal 5 - Progress (Week 1): Progressing toward goal Week 2:    STG=LTG d/t ELOS  Skilled Therapeutic Interventions/Progress Updates:    1:1. Pt with pain "all over" and RN delivers pain medication. Pt very excited and engaged by game learned in previous OT session, "spot it." However, when presented with opportunities to explain game to OT and student in 2 separate instances, pt unable to recall accurate directions or read directions to communicate to players. When agreed upon rules established pt able to maintain selective attention to game with TV on for ~5-10 min per game. Pt easily distracted by external stimuli (I.e. people walking in hallway) but pt able ot refocus on game with min VC. Pt begins to perseverate on going fishing showing "pond" outside of room window. Pt mildly redirectable, however session terminated early d/t decreased attention to tasks/participation in presented activities. Exited session with pt standing in room, sheet across door and all needs met.   Therapy Documentation Precautions:  Precautions Precautions: None Precaution Comments: easily agitated Restrictions Weight Bearing Restrictions: No General:   Vital Signs: Therapy Vitals Temp: 98.2 F (36.8 C) Temp Source: Oral Pulse Rate: 62 Resp: 16 BP: 109/61 Patient Position (if appropriate): Lying Oxygen Therapy SpO2: 98 % O2 Device: Room Air Pain:   ADL: ADL Grooming: Unable to assess, Other (comment)(pt refused) Upper Body Bathing: Unable to assess(refused) Lower Body Bathing: Unable to assess(refused) Upper Body Dressing: Contact guard Where Assessed-Upper Body Dressing: Standing at sink Lower Body Dressing: Minimal assistance Where Assessed-Lower Body Dressing:  Standing at sink Toileting: Minimal assistance(stnading) Where Assessed-Toileting: Toilet(standing) Tub/Shower Transfer: Minimal assistance Tub/Shower Transfer Method:  Ambulating Tub/Shower Equipment: Landscape architect   Exercises:   Other Treatments:     Therapy/Group: Individual Therapy  Tonny Branch 02/14/2019, 6:51 AM

## 2019-02-14 NOTE — Plan of Care (Signed)
  Problem: Consults Goal: RH BRAIN INJURY PATIENT EDUCATION Description: Description: See Patient Education module for eduction specifics Outcome: Progressing Goal: Skin Care Protocol Initiated - if Braden Score 18 or less Description: If consults are not indicated, leave blank or document N/A Outcome: Progressing   Problem: RH BOWEL ELIMINATION Goal: RH STG MANAGE BOWEL WITH ASSISTANCE Description: STG Manage Bowel with mod I Assistance. Outcome: Progressing Goal: RH STG MANAGE BOWEL W/MEDICATION W/ASSISTANCE Description: STG Manage Bowel with Medication with mod I Assistance. Outcome: Progressing   Problem: RH SAFETY Goal: RH STG ADHERE TO SAFETY PRECAUTIONS W/ASSISTANCE/DEVICE Description: STG Adhere to Safety Precautions With min/mod Assistance/Device. Outcome: Progressing   Problem: RH COGNITION-NURSING Goal: RH STG USES MEMORY AIDS/STRATEGIES W/ASSIST TO PROBLEM SOLVE Description: STG Uses Memory Aids/Strategies With min/mod Assistance to Problem Solve. Outcome: Progressing   Problem: RH KNOWLEDGE DEFICIT BRAIN INJURY Goal: RH STG INCREASE KNOWLEDGE OF SELF CARE AFTER BRAIN INJURY Description: Patient/caregiver will verbalize understanding of brain injury recovery including medications, safety, stages of recovery, etc. With min/mod assist. Outcome: Progressing   
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
## 2019-02-15 ENCOUNTER — Inpatient Hospital Stay (HOSPITAL_COMMUNITY): Payer: Self-pay | Admitting: Physical Therapy

## 2019-02-15 NOTE — Progress Notes (Signed)
Patient perseverating on talking to his girlfriend Misty.  Assisted patient dialing her number at the nurses station.  Patient asked her to bring him some cigarettes and other personal items (Dr. Malachi Bonds, etc.).  Overheard Misty telling him she could not come up here and he could not smoke in the hospital.  Patient remained calm during and after the call.  Brita Romp, RN

## 2019-02-15 NOTE — Progress Notes (Signed)
Patient at the nurses station threatening to leave all of his things packed. RN talking to patient .
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
## 2019-02-15 NOTE — Progress Notes (Signed)
Was able to redirect patient away from the exit doors by telling him that [place] (his GF) will call the desk when she arrives.  This RN spoke with Misty on the phone and confirmed with her that he isn't leaving today but to play along so that patient would have ample time to deescalate.  Was able to distract patient by looking at shoes on the computer.  Patient started to verbalize inappropriate sexual advancements and perseverated on them despite this RNs attempt to set boundaries.  Dinner arrived and patient reluctantly returned to his room with all of his belongings to eat where he then fell asleep after eating.  Brita Romp, RN
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
## 2019-02-16 ENCOUNTER — Inpatient Hospital Stay (HOSPITAL_COMMUNITY): Payer: Self-pay | Admitting: Occupational Therapy
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
## 2019-02-16 NOTE — Progress Notes (Signed)
Patient is awake, ate his breakfast , took his meds and seen going to patient room when asked he said he was asking for cigarette. RN gave his nicotine gum. Patient is talking about his celfone again ; trying to call his mom and GF.

## 2019-02-16 NOTE — Progress Notes (Signed)
Occupational Therapy Session Note  Patient Details  Name: Billy Malone MRN: [id number] Date of Birth: 10/24/1995  Today's Date: 02/16/2019 OT Individual Time: 1402-1430 OT Individual Time Calculation (min): 28 min   Skilled Therapeutic Interventions/Progress Updates:    Pt greeted at the entrance of his room. Agreeable to help OT with sorting/organizing therapy equipment in the dayroom. After handwashing, he ambulated to the dayroom. Worked on higher level balance and sustained attention while stooping and removing therapy items from shelves and reorganizing them neatly. He reorganize the [place] station as well, rising from kneeling positions without LOBs. Independent for toilet transfer and toileting using public restroom. Vcs for finding his way back to his room. Pt was left speaking with RN at [employer] station.     Therapy Documentation Precautions:  Precautions Precautions: None Precaution Comments: easily agitated Restrictions Weight Bearing Restrictions: No Pain: No s/s pain during tx Pain Assessment Pain Scale: 0-10 Faces Pain Scale: Hurts little more Pain Type: Acute pain Pain Location: Head Pain Descriptors / Indicators: Throbbing Pain Onset: On-going Pain Intervention(s): Medication (See eMAR) ADL: ADL Grooming: Unable to assess, Other (comment)(pt refused) Upper Body Bathing: Unable to assess(refused) Lower Body Bathing: Unable to assess(refused) Upper Body Dressing: Contact guard Where Assessed-Upper Body Dressing: Standing at sink Lower Body Dressing: Minimal assistance Where Assessed-Lower Body Dressing: Standing at sink Toileting: Minimal assistance(stnading) Where Assessed-Toileting: Toilet(standing) Tub/Shower Transfer: Minimal assistance Tub/Shower Transfer Method: Ambulating Tub/Shower Equipment: Grab bars      Therapy/Group: Individual Therapy  Lashawna Poche A Jatavia Keltner 02/16/2019, 4:53 PM

## 2019-02-16 NOTE — Progress Notes (Signed)
Patient is getting agitated med given ; patient claims he needs to get out of here and waiting for his old lady.

## 2019-02-16 NOTE — Progress Notes (Signed)
[place] PHYSICAL MEDICINE & REHABILITATION PROGRESS NOTE   Subjective/Complaints:  Per nursing, pt is sleepy this AM because didn't sleep well overnight- was up, based on sleep chart at 11pm and 3am, trying to leave- got agitated, trying to leave and required both prns meds.  Pt asleep currently, but there is a LOT of junk food wrappers at bedside.   ROS: Limited due to cognitive/behavioral  Objective:   No results found. No results for input(s): WBC, HGB, HCT, PLT in the last 72 hours. No results for input(s): NA, K, CL, CO2, GLUCOSE, BUN, CREATININE, CALCIUM in the last 72 hours.  Intake/Output Summary (Last 24 hours) at 02/16/2019 1118 Last data filed at 02/15/2019 1800 Gross per 24 hour  Intake 360 ml  Output -  Net 360 ml     Physical Exam: Vital Signs Blood pressure (!) 109/58, pulse 82, temperature 98.1 F (36.7 C), temperature source Oral, resp. rate 16, height 6' (1.829 m), weight 77.2 kg, SpO2 99 %.  Constitutional: No distress . Vital signs reviewed. Sleepy; lying in bed curled up; didn't wake up, even with exam HEENT: EOMI, oral membranes moist Neck: supple Cardiovascular: RRR without murmur.    Respiratory: CTA Bilaterally without wheezes or rales. Normal effort    GI: BS +, non-tender, non-distended  Extremities: No clubbing, cyanosis, or edema. Pulses are 2+ Skin: Clean and intact without signs of breakdown. Numerous tats on all 4's and face Neuro: oriented to self, hospital. Follows simple commands. Is distracted. Cranial nerves 2-12 are grossly intact. Sensory exam is normal. Reflexes are 2+ in all 4's. No tremors. Motor function is grossly 4-5/5 with effort component Musculoskeletal: Full ROM, No pain with AROM or PROM in the neck, trunk, or extremities.  Psych:  Asleep; woke 1-2 seconds to say "go away"      Assessment/Plan: 1. Functional deficits secondary to TBI which require 3+ hours per day of interdisciplinary therapy in a comprehensive inpatient  rehab setting.  Physiatrist is providing close team supervision and 24 hour management of active medical problems listed below.  Physiatrist and rehab team continue to assess barriers to discharge/monitor patient progress toward functional and medical goals  Care Tool:  Bathing  Bathing activity did not occur: Refused Body parts bathed by patient: Right arm, Left upper leg, Right lower leg, Left arm, Chest, Front perineal area, Left lower leg, Abdomen, Face, Buttocks, Right upper leg   Body parts bathed by helper: Right arm, Chest, Left arm, Abdomen, Front perineal area, Buttocks, Right upper leg, Left upper leg, Right lower leg, Left lower leg, Face     Bathing assist Assist Level: Supervision/Verbal cueing     Upper Body Dressing/Undressing Upper body dressing   What is the patient wearing?: Pull over shirt    Upper body assist Assist Level: Independent    Lower Body Dressing/Undressing Lower body dressing      What is the patient wearing?: Pants     Lower body assist Assist for lower body dressing: Independent     Toileting Toileting    Toileting assist Assist for toileting: Independent     Transfers Chair/bed transfer  Transfers assist     Chair/bed transfer assist level: Independent     Locomotion Ambulation   Ambulation assist      Assist level: Independent Assistive device: No Device Max distance: 150 ft   Walk 10 feet activity   Assist     Assist level: Independent Assistive device: No Device   Walk 50 feet activity  Assist    Assist level: Independent Assistive device: No Device    Walk 150 feet activity   Assist Walk 150 feet activity did not occur: Safety/medical concerns(agitation)  Assist level: Independent Assistive device: No Device    Walk 10 feet on uneven surface  activity   Assist Walk 10 feet on uneven surfaces activity did not occur: Safety/medical concerns(agitation)          Wheelchair     Assist Will patient use wheelchair at discharge?: No             Wheelchair 50 feet with 2 turns activity    Assist            Wheelchair 150 feet activity     Assist          Blood pressure (!) 109/58, pulse 82, temperature 98.1 F (36.7 C), temperature source Oral, resp. rate 16, height 6' (1.829 m), weight 77.2 kg, SpO2 99 %.  Medical Problem List and Plan: 1.Imapired function/ADLs/mobility/cognitionsecondary toTBI, depressed skull fx with [employer] Level V+  -Continue CIR therapies today including PT, SLP and OT   2. Antithrombotics: -DVT/anticoagulation:Pharmaceutical:Lovenox -antiplatelet therapy: N/A 3. Pain Management:continue oxycodone prnfor headaches 4. Mood: generally has shown some signs of improvement. -antipsychotic agents: continue HS seroquel 200mg  at 2000 with 50mg  q8 hrs if needed  -environmental mod  -normalizing sleep-wake cycle may be difficult given prior sleep habits but we seem to be making progress  -change/increase propranolol to inderal xl 80mg  to help with number of pills  -continue concerta (had used previously at some point d/t ADHD) for distractibility  -prn klonopin severe agitation  -schedule dose of klonopin at 4pm daily to see if we can head off agitation which is worse in the early evening typically  9/6- still getting agitated/trying to leave multiple times per day- con't current meds scheduled and prns 5. Neuropsych: This patientisNOTcapable of making decisions onhisown behalf. Vitals:   02/15/19 2136 02/16/19 0622  BP: (!) 102/57 (!) 109/58  Pulse: 79 82  Resp: 18 16  Temp: 98.9 F (37.2 C) 98.1 F (36.7 C)  SpO2: 99% 99%  mild bradycardia, d/t inderal 6. Skin/Wound Care:Routine pressure relief measures. 7. Fluids/Electrolytes/Nutrition:good po intake.  .   -double portions requested 8. Leucocytosis: wbc 8.4       LOS: 11 days A FACE  TO FACE EVALUATION WAS PERFORMED  Lanee Chain 02/16/2019, 11:18 AM

## 2019-02-17 ENCOUNTER — Encounter (HOSPITAL_COMMUNITY): Payer: Self-pay | Admitting: Speech Pathology

## 2019-02-17 ENCOUNTER — Inpatient Hospital Stay (HOSPITAL_COMMUNITY): Payer: Self-pay

## 2019-02-17 ENCOUNTER — Encounter (HOSPITAL_COMMUNITY): Payer: Self-pay

## 2019-02-17 ENCOUNTER — Ambulatory Visit (HOSPITAL_COMMUNITY): Payer: Self-pay

## 2019-02-17 MED ORDER — TRAMADOL HCL 50 MG PO TABS: 50.0000 mg | ORAL_TABLET | Freq: Four times a day (QID) | ORAL | Status: DC | PRN

## 2019-02-17 MED ORDER — DIVALPROEX SODIUM ER 500 MG PO TB24: 1000.0000 mg | ORAL_TABLET | Freq: Every day | ORAL | 0 refills | Status: DC

## 2019-02-17 MED ORDER — ACETAMINOPHEN 325 MG PO TABS: 650.0000 mg | ORAL_TABLET | ORAL | 1 refills | Status: DC | PRN

## 2019-02-17 MED ORDER — QUETIAPINE FUMARATE 50 MG PO TABS
100.0000 mg | ORAL_TABLET | Freq: Every day | ORAL | Status: DC
  Administered 2019-02-17: 100 mg via ORAL
  Filled 2019-02-17: qty 2

## 2019-02-17 MED ORDER — QUETIAPINE FUMARATE 50 MG PO TABS: ORAL_TABLET | ORAL | 0 refills | Status: DC

## 2019-02-17 MED ORDER — METHYLPHENIDATE HCL ER (OSM) 18 MG PO TBCR
36.0000 mg | EXTENDED_RELEASE_TABLET | Freq: Every day | ORAL | Status: DC
  Administered 2019-02-18: 07:00:00 36 mg via ORAL
  Filled 2019-02-17: qty 2

## 2019-02-17 MED ORDER — NICOTINE POLACRILEX 2 MG MT GUM: 2.0000 mg | CHEWING_GUM | OROMUCOSAL | 0 refills | Status: DC | PRN

## 2019-02-17 MED ORDER — CLONAZEPAM 1 MG PO TABS: 1.0000 mg | ORAL_TABLET | Freq: Three times a day (TID) | ORAL | 0 refills | Status: DC | PRN

## 2019-02-17 MED ORDER — METHYLPHENIDATE HCL ER (OSM) 36 MG PO TBCR: 36.0000 mg | EXTENDED_RELEASE_TABLET | Freq: Every day | ORAL | 0 refills | Status: DC

## 2019-02-17 MED ORDER — DIVALPROEX SODIUM ER 500 MG PO TB24
500.0000 mg | ORAL_TABLET | Freq: Once | ORAL | Status: AC
  Administered 2019-02-17: 500 mg via ORAL
  Filled 2019-02-17: qty 1

## 2019-02-17 MED ORDER — DIVALPROEX SODIUM ER 500 MG PO TB24
1000.0000 mg | ORAL_TABLET | Freq: Every day | ORAL | Status: DC
  Administered 2019-02-18: 09:00:00 1000 mg via ORAL
  Filled 2019-02-17: qty 2

## 2019-02-17 MED ORDER — PROPRANOLOL HCL ER 80 MG PO CP24: 80.0000 mg | ORAL_CAPSULE | Freq: Every day | ORAL | 0 refills | Status: DC

## 2019-02-17 NOTE — Progress Notes (Signed)
Occupational Therapy Session Note  Patient Details  Name: Westyn Driggers MRN: [id number] Date of Birth: 07-05-1995  Today's Date: 02/17/2019 OT Individual Time: 6579-0383 OT Individual Time Calculation (min): 15 min    Short Term Goals: Week 1:  OT Short Term Goal 1 (Week 1): Pt will complete bathing and dressing with no more than min VC for seated positioning/leaning for safety awarness OT Short Term Goal 1 - Progress (Week 1): Met OT Short Term Goal 2 (Week 1): Pt will don shirt with set up OT Short Term Goal 2 - Progress (Week 1): Met OT Short Term Goal 3 (Week 1): Pt will initiate grooming wiht no more than min VC OT Short Term Goal 3 - Progress (Week 1): Met OT Short Term Goal 4 (Week 1): Pt will sustain attention to task in minimally distracting environment for 5 min OT Short Term Goal 4 - Progress (Week 1): Met OT Short Term Goal 5 (Week 1): Pt will be oriented to place with MAX VC and use of external aides PRN OT Short Term Goal 5 - Progress (Week 1): Progressing toward goal  Skilled Therapeutic Interventions/Progress Updates:    Pt received supine, still worked up over mother and girlfriend recently visiting. Both were scheduled to stay for family education session but d/t agitation left. Care coordination completed with CSW re education needs of family. Attempted to further engage pt in activity but he became more agitated and requested to lay down. Pt was left supine with all needs met.   Therapy Documentation Precautions:  Precautions Precautions: None Precaution Comments: easily agitated Restrictions Weight Bearing Restrictions: No   Therapy/Group: Individual Therapy  Curtis Sites 02/17/2019, 4:41 PM

## 2019-02-17 NOTE — Progress Notes (Signed)
Occupational Therapy Discharge Summary  Patient Details  Name: Billy Malone MRN: [id number] Date of Birth: [birth year]  Today's Date: 02/17/2019 OT Individual Time: 1050-1200 OT Individual Time Calculation (min): 70 min    Patient has met 10 of 11 long term goals due to improved activity tolerance, improved balance, postural control, ability to compensate for deficits, improved attention and improved coordination.  Patient to discharge at overall Independent level.  Patient's care partner is independent to provide the necessary cognitive assistance at discharge.    Reasons goals not met: Pt remains impaired in his awareness and is unable to currently move past intellectual awareness. Pt's mother has been educated on this and will provide 24/7 supervision at home.   Recommendation:  Patient will benefit from behavioral health follow up.   Equipment: No equipment provided  Reasons for discharge: treatment goals met and discharge from hospital   Patient/family agrees with progress made and goals achieved: Yes   Skilled OT Intervention: Pt received sitting EOB with no c/o pain. Pt ate breakfast while he and this therapist talked, to challenge attention to task and ability to stay on topic during conversation. Pt became frustrated by character on TV that he disliked, requiring mod cueing to redirect and calm down. Used cueing to attempt and guide pt toward awareness, pt unable to demonstrate anything further than intellectual awareness. Pt completed sequencing task of being given an instruction sheet of hanging decals on doorways. Pt able to use external cues and find appropriate doorways. Pt returned to his room and transferred onto toilet and then into shower. Pt able to complete all bathing and dressing at mod I level. Pt was left supine with all needs met.   OT Discharge Precautions/Restrictions  Precautions Precautions: None Precaution Comments: easily  agitated Restrictions Weight Bearing Restrictions: No Pain Pain Assessment Pain Scale: 0-10 Pain Score: 0-No pain ADL ADL Eating: Independent Where Assessed-Eating: Chair Grooming: Independent Where Assessed-Grooming: Standing at sink Upper Body Bathing: Independent Where Assessed-Upper Body Bathing: Shower Lower Body Bathing: Independent Where Assessed-Lower Body Bathing: Shower Upper Body Dressing: Independent Where Assessed-Upper Body Dressing: Standing at sink Lower Body Dressing: Independent Where Assessed-Lower Body Dressing: Sitting at sink Toileting: Independent Where Assessed-Toileting: Glass blower/designer: Programmer, applications Method: Human resources officer: Theatre stage manager Method: Optometrist: Walk in Retail buyer: Independent Vision Baseline Vision/History: No visual deficits Patient Visual Report: No change from baseline Vision Assessment?: No apparent visual deficits Perception  Perception: Within Functional Limits Praxis Praxis: Intact Cognition Overall Cognitive Status: Impaired/Different from baseline Arousal/Alertness: Awake/alert Orientation Level: Oriented to person;Disoriented to place;Disoriented to time;Disoriented to situation Attention: Sustained Focused Attention: Appears intact Sustained Attention Impairment: Functional complex Memory: Impaired Memory Impairment: Storage deficit;Decreased recall of new information;Decreased short term memory Decreased Long Term Memory: Verbal basic;Functional basic Decreased Short Term Memory: Verbal basic;Functional basic Awareness Impairment: Intellectual impairment Problem Solving: Impaired Problem Solving Impairment: Verbal basic;Functional basic Behaviors: Confabulation;Impulsive;Restless;Perseveration;Verbal agitation Safety/Judgment: Impaired [employer] Scales of Cognitive Functioning:  Confused/inappropriate/non-agitated Sensation Sensation Light Touch: Appears Intact Proprioception: Appears Intact Coordination Gross Motor Movements are Fluid and Coordinated: Yes Fine Motor Movements are Fluid and Coordinated: Yes Motor  Motor Motor: Within Functional Limits Mobility  Bed Mobility Bed Mobility: Rolling Right;Rolling Left;Supine to Sit Rolling Right: Independent Rolling Left: Independent Supine to Sit: Independent Transfers Sit to Stand: Independent Stand to Sit: Independent  Trunk/Postural Assessment  Cervical Assessment Cervical Assessment: Within Functional Limits Thoracic Assessment Thoracic Assessment: Within Functional Limits Lumbar Assessment Lumbar Assessment:  Within Functional Limits Postural Control Postural Control: Within Functional Limits  Balance Balance Balance Assessed: Yes Static Sitting Balance Static Sitting - Balance Support: No upper extremity supported;Feet supported Static Sitting - Level of Assistance: 7: Independent Dynamic Sitting Balance Dynamic Sitting - Balance Support: Feet supported;Bilateral upper extremity supported Dynamic Sitting - Level of Assistance: 7: Independent Static Standing Balance Static Standing - Balance Support: During functional activity Static Standing - Level of Assistance: 7: Independent Dynamic Standing Balance Dynamic Standing - Balance Support: During functional activity Dynamic Standing - Level of Assistance: 7: Independent Extremity/Trunk Assessment RUE Assessment RUE Assessment: Within Functional Limits LUE Assessment LUE Assessment: Within Functional Limits   Curtis Sites 02/17/2019, 7:28 AM

## 2019-02-17 NOTE — Progress Notes (Signed)
[place] PHYSICAL MEDICINE & REHABILITATION PROGRESS NOTE   Subjective/Complaints:  Wound up again in early evening but seemed to settle down ok per RN. Was still sleeping this morning when I walked in  ROS: Limited due to cognitive/behavioral   Objective:   No results found. No results for input(s): WBC, HGB, HCT, PLT in the last 72 hours. No results for input(s): NA, K, CL, CO2, GLUCOSE, BUN, CREATININE, CALCIUM in the last 72 hours. No intake or output data in the 24 hours ending 02/17/19 0953   Physical Exam: Vital Signs Blood pressure 107/65, pulse 64, temperature 97.7 F (36.5 C), temperature source Oral, resp. rate 16, height 6' (1.829 m), weight 77.2 kg, SpO2 100 %.  Constitutional: No distress . Vital signs reviewed. HEENT: EOMI, oral membranes moist Neck: supple Cardiovascular: RRR without murmur. No JVD    Respiratory: CTA Bilaterally without wheezes or rales. Normal effort    GI: BS +, non-tender, non-distended  Extremities: No clubbing, cyanosis, or edema. Pulses are 2+ Skin: Clean and intact without signs of breakdown. Numerous tats on all 4's and face Neuro: oriented to hospital and self.  Cranial nerves 2-12 are grossly intact. Sensory exam is normal. Reflexes are 2+ in all 4's. No tremors. Motor function is grossly 4-5/5 with effort component Musculoskeletal: Full ROM, No pain with AROM or PROM in the neck, trunk, or extremities.  Psych:  Slow to react this morning     Assessment/Plan: 1. Functional deficits secondary to TBI which require 3+ hours per day of interdisciplinary therapy in a comprehensive inpatient rehab setting.  Physiatrist is providing close team supervision and 24 hour management of active medical problems listed below.  Physiatrist and rehab team continue to assess barriers to discharge/monitor patient progress toward functional and medical goals  Care Tool:  Bathing  Bathing activity did not occur: Refused Body parts bathed by  patient: Right arm, Left upper leg, Right lower leg, Left arm, Chest, Front perineal area, Left lower leg, Abdomen, Face, Buttocks, Right upper leg   Body parts bathed by helper: Right arm, Chest, Left arm, Abdomen, Front perineal area, Buttocks, Right upper leg, Left upper leg, Right lower leg, Left lower leg, Face     Bathing assist Assist Level: Supervision/Verbal cueing     Upper Body Dressing/Undressing Upper body dressing   What is the patient wearing?: Pull over shirt    Upper body assist Assist Level: Independent    Lower Body Dressing/Undressing Lower body dressing      What is the patient wearing?: Pants     Lower body assist Assist for lower body dressing: Independent     Toileting Toileting    Toileting assist Assist for toileting: Independent     Transfers Chair/bed transfer  Transfers assist     Chair/bed transfer assist level: Independent     Locomotion Ambulation   Ambulation assist      Assist level: Independent Assistive device: No Device Max distance: 150 ft   Walk 10 feet activity   Assist     Assist level: Independent Assistive device: No Device   Walk 50 feet activity   Assist    Assist level: Independent Assistive device: No Device    Walk 150 feet activity   Assist Walk 150 feet activity did not occur: Safety/medical concerns(agitation)  Assist level: Independent Assistive device: No Device    Walk 10 feet on uneven surface  activity   Assist Walk 10 feet on uneven surfaces activity did not occur: Safety/medical  concerns(agitation)         Wheelchair     Assist Will patient use wheelchair at discharge?: No             Wheelchair 50 feet with 2 turns activity    Assist            Wheelchair 150 feet activity     Assist          Blood pressure 107/65, pulse 64, temperature 97.7 F (36.5 C), temperature source Oral, resp. rate 16, height 6' (1.829 m), weight 77.2 kg, SpO2  100 %.  Medical Problem List and Plan: 1.Imapired function/ADLs/mobility/cognitionsecondary toTBI, depressed skull fx with [employer] Level V+  -Continue CIR therapies today including PT, SLP and OT   2. Antithrombotics: -DVT/anticoagulation:Pharmaceutical:Lovenox -antiplatelet therapy: N/A 3. Pain Management:continue oxycodone prnfor headaches 4. Mood: generally has shown some signs of improvement. -antipsychotic agents: continue HS seroquel but reduce to 100mg  at 2000 with 50mg  q8 hrs if needed  -environmental mod  -normalizing sleep-wake cycle may be difficult given prior sleep habits but we seem to be making progress  -  Propranolol inderal xl 80mg     -increase concerta to 36mg (had used previously at some point d/t ADHD) for distractibility  -prn klonopin severe agitation  -scheduled dose of klonopin at 4pm daily to see if we can head off agitation which is worse in the early evening typically  -increase depakote ER to 1000mg  daily for mood stabilization 5. Neuropsych: This patientisNOTcapable of making decisions onhisown behalf. Vitals:   02/16/19 2003 02/17/19 0436  BP: 109/73 107/65  Pulse: (!) 104 64  Resp: 18 16  Temp: 98.2 F (36.8 C) 97.7 F (36.5 C)  SpO2: 99% 100%  mild bradycardia, d/t inderal---no change 6. Skin/Wound Care:Routine pressure relief measures. 7. Fluids/Electrolytes/Nutrition:good po intake.  .   -double portions requested 8. Leucocytosis: wbc 8.4       LOS: 12 days A FACE TO FACE EVALUATION WAS PERFORMED  Ranelle OysterZachary T Montzerrat Brunell 02/17/2019, 9:53 AM

## 2019-02-17 NOTE — Plan of Care (Signed)
  Problem: Consults Goal: RH BRAIN INJURY PATIENT EDUCATION Description: Description: See Patient Education module for eduction specifics Outcome: Progressing Goal: Skin Care Protocol Initiated - if Braden Score 18 or less Description: If consults are not indicated, leave blank or document N/A Outcome: Progressing   Problem: RH BOWEL ELIMINATION Goal: RH STG MANAGE BOWEL WITH ASSISTANCE Description: STG Manage Bowel with mod I Assistance. Outcome: Progressing Goal: RH STG MANAGE BOWEL W/MEDICATION W/ASSISTANCE Description: STG Manage Bowel with Medication with mod I Assistance. Outcome: Progressing   Problem: RH SAFETY Goal: RH STG ADHERE TO SAFETY PRECAUTIONS W/ASSISTANCE/DEVICE Description: STG Adhere to Safety Precautions With min/mod Assistance/Device. Outcome: Progressing   Problem: RH COGNITION-NURSING Goal: RH STG USES MEMORY AIDS/STRATEGIES W/ASSIST TO PROBLEM SOLVE Description: STG Uses Memory Aids/Strategies With min/mod Assistance to Problem Solve. Outcome: Progressing   Problem: RH KNOWLEDGE DEFICIT BRAIN INJURY Goal: RH STG INCREASE KNOWLEDGE OF SELF CARE AFTER BRAIN INJURY Description: Patient/caregiver will verbalize understanding of brain injury recovery including medications, safety, stages of recovery, etc. With min/mod assist. Outcome: Progressing   

## 2019-02-17 NOTE — Progress Notes (Signed)
Physical Therapy Discharge Summary  Patient Details  Name: Billy Malone MRN: [id number] Date of Birth: 11-06-1995  Today's Date: 02/17/2019 PT Individual Time: 6270-3500 PT Individual Time Calculation (min): 45 min    Patient has met 8 of 9 long term goals due to improved activity tolerance, improved balance and decreased pain.  Patient to discharge at an ambulatory level Supervision due to cognitive deficits.   Patient's care partner is independent to provide the necessary cognitive assistance at discharge.  Reasons goals not met: pt declined floor transfer  Recommendation:  Further PT is not indicated at this time.   Equipment: No equipment provided  Reasons for discharge: most treatment goals met and discharge from hospital  Patient/family agrees with progress made and goals achieved: Yes  PT Discharge Pt's SO, Misty her for family ed.  She reported that pt's mother's house has 3 STE, no rails.  Pt initially refused to participate, but eventually agreed to leave his room.  Misty observed pt ambulating on level tile, up/down 12 steps without use of rails, on mulched area, up/down curb/step, up/down ramp without LOB.  Pt able to balance in R/L single limb stance x > 10 seconds each.  Pt balanced in R/L tandem stance x 20 seconds each . Pt used toilet independently during session.  He intermittently verbally escalated during session, but easily re-directed.  PT instructed Misty in doing this while pt was in bathroom. Pt perseverated about not having his cigarettes or cell phone, but accepted PT 's explanations as to why cigarettes are not allowed and that his mom has his cell phone.  At end of session, pt in bed, resting with Misty in room.  Precautions/Restrictions Precautions Precautions: None Precaution Comments: easily agitated Restrictions Weight Bearing Restrictions: No Vital Signs Therapy Vitals Temp: 98 F (36.7 C) Temp Source: Oral Pulse Rate: 90 Resp: 18 BP:  116/82 Patient Position (if appropriate): Sitting Oxygen Therapy SpO2: 100 % O2 Device: Room Air Pain Pain Assessment Pain Score: 0-No pain Vision/Perception     Cognition Overall Cognitive Status: Impaired/Different from baseline Arousal/Alertness: Awake/alert Orientation Level: Oriented to person;Disoriented to place;Disoriented to time;Disoriented to situation Attention: Sustained Focused Attention: Appears intact Sustained Attention Impairment: Functional complex Memory: Impaired Memory Impairment: Storage deficit;Decreased recall of new information;Decreased short term memory Decreased Long Term Memory: Verbal basic;Functional basic Decreased Short Term Memory: Verbal basic;Functional basic Awareness Impairment: Intellectual impairment Problem Solving: Impaired Problem Solving Impairment: Verbal basic;Functional basic Behaviors: Confabulation;Impulsive;Restless;Perseveration;Verbal agitation Safety/Judgment: Impaired [employer] Scales of Cognitive Functioning: Confused/inappropriate/non-agitated Sensation Sensation Light Touch: Appears Intact Proprioception: Appears Intact Coordination Gross Motor Movements are Fluid and Coordinated: Yes Fine Motor Movements are Fluid and Coordinated: Not tested Motor  Motor Motor: Within Functional Limits  Mobility Bed Mobility Bed Mobility: Rolling Right;Rolling Left;Right Sidelying to Sit Rolling Right: Independent Rolling Left: Independent Right Sidelying to Sit: Independent Supine to Sit: Independent Transfers Transfers: Stand Pivot Transfers Sit to Stand: Independent Stand to Sit: Independent Stand Pivot Transfers: Independent Transfer (Assistive device): None Locomotion  Gait Ambulation: Yes Gait Assistance: Supervision/Verbal cueing Gait Distance (Feet): 200 Feet Assistive device: None Gait Gait: Yes Gait Pattern: Within Functional Limits Stairs / Additional Locomotion Stairs: Yes Stairs Assistance:  Supervision/Verbal cueing Stair Management Technique: No rails Number of Stairs: 12 Height of Stairs: 6(and 3) Ramp: Supervision/Verbal cueing Curb: Supervision/Verbal cueing Wheelchair Mobility Wheelchair Mobility: No  Trunk/Postural Assessment     Balance Balance Balance Assessed: Yes Static Sitting Balance Static Sitting - Balance Support: No upper extremity supported;Feet supported  Static Sitting - Level of Assistance: 7: Independent Dynamic Sitting Balance Dynamic Sitting - Balance Support: Feet supported;Bilateral upper extremity supported Dynamic Sitting - Level of Assistance: 7: Independent Static Standing Balance Static Standing - Balance Support: During functional activity Static Standing - Level of Assistance: 7: Independent Dynamic Standing Balance Dynamic Standing - Balance Support: During functional activity Dynamic Standing - Level of Assistance: 7: Independent Dynamic Standing - Comments: pt balances in R or L tandem position x 20seconds each; SLS x 10 seconds each Extremity Assessment      RLE Assessment RLE Assessment: Within Functional Limits LLE Assessment LLE Assessment: Within Functional Limits    Deloyce Walthers 02/17/2019, 4:43 PM

## 2019-02-17 NOTE — Progress Notes (Signed)
Social Work Patient ID: Billy Malone, male   DOB: 10-20-1995, 23 y.o.   MRN: [id number]  Mother and girlfriend here this afternoon to go through family education.  Pt fairly calm with them until time for them to leave. Reesa Chew, PA-C and I were able to meet with mother and review medical issues and medication management as well as behavioral management at home.  Mother very nervous about the home environment and managing several people, including young children and patient, at one time.  She does have some good supports including pt's grandparents and brother who she believes can help to "calm down" patient if needed.  Have reviewed proactive tasks that she might consider including alerting local law enforcement of pt's situation/ BI as he "has had run-ins with them several times in the past."  Reviewed calling 911 and local mental health crisis units (handout info provided) in emergency case.  Discussed designating a couple of pt's friends who she might be able to rely on to provide appropriate supervision if pt should leave with them.  Plan to review with again prior to d/c tomorrow and provide more handout information about TBI management.  Continue to follow.  Ronald Londo, LCSW

## 2019-02-17 NOTE — Progress Notes (Signed)
Patient perseverating on wanting to have cigarettes around 8pm, easily redirected by offering snacks and nicotine gum. Slept all night this shift. Will monitor.

## 2019-02-17 NOTE — Progress Notes (Signed)
Speech Language Pathology Daily Session Note  Patient Details  Name: Billy Malone MRN: [id number] Date of Birth: 1995/10/08  Today's Date: 02/17/2019 SLP Individual Time: 1300-1330 SLP Individual Time Calculation (min): 30 min  Short Term Goals: Week 2: SLP Short Term Goal 1 (Week 2): STGs=LTGs due to ELOS  Skilled Therapeutic Interventions:  Skilled treatment session was scheduled for caregiver education with pt, his mother and his girlfriend. SLP received pt bed in both family members present. As this Probation officer begin providing general information, pt was argumentative that he was not in a car wreck and despite general redirection to his mother pt perseverative on "nothing was wrong with him." His mother begin asking him questions about the car he began describing, SLP attempted to redirect his mother as she began telling him he didn't ever have a car that he was describing. Pt began perseveratively talking about talking "19 blood pressure medicines" at another hospital. He began cussing and threatening to "bash anyone's face" that ever gave him such medicines again. His mother continued to state that she had lots of questions for "the team," as pt continued to get louder about blood pressure medicines. She mother commented "Billy Malone you are getting on my nerves." Billy Malone continued to become more animated, SLP stated that his mother and I would leave the room to ask about the medicines he was talking about. SLP also attempted to locate behavior plan in chart and in room but unable to locate one. I pressed nurse call light to request code STAR but pt was becoming more animated and his mother stood up and moved towards pt. She was more visually distressed as well. This writer pressed distress button as pt began punching his hand to indicate what he would do to "any doctors." Nursing staff arrived. This Probation officer told mother that she and I were leaving the room, this took several attempts, finally she and I  left the room. SLP handed the mother off to [place] for any further questions.      Pain    Therapy/Group: Individual Therapy  Billy Malone 02/17/2019, 2:23 PM

## 2019-02-18 MED ORDER — CLONAZEPAM 1 MG PO TABS: 1.0000 mg | ORAL_TABLET | Freq: Three times a day (TID) | ORAL | 0 refills | Status: DC | PRN

## 2019-02-18 MED ORDER — METHYLPHENIDATE HCL ER (OSM) 36 MG PO TBCR: 36.0000 mg | EXTENDED_RELEASE_TABLET | Freq: Every day | ORAL | 0 refills | Status: DC

## 2019-02-18 MED ORDER — DIVALPROEX SODIUM ER 500 MG PO TB24: 1000.0000 mg | ORAL_TABLET | Freq: Every day | ORAL | 0 refills | Status: DC

## 2019-02-18 MED ORDER — NICOTINE POLACRILEX 2 MG MT GUM: 2.0000 mg | CHEWING_GUM | OROMUCOSAL | 0 refills | Status: DC | PRN

## 2019-02-18 MED ORDER — PROPRANOLOL HCL ER 80 MG PO CP24: 80.0000 mg | ORAL_CAPSULE | Freq: Every day | ORAL | 0 refills | Status: DC

## 2019-02-18 MED ORDER — QUETIAPINE FUMARATE 50 MG PO TABS: ORAL_TABLET | ORAL | 0 refills | Status: DC

## 2019-02-18 MED FILL — QUETIAPINE FUMARATE 50 MG T: 50 | 30 days supply | Qty: 90 | Fill #0

## 2019-02-18 MED FILL — PROPRANOLOL ER 80 MG CAP: 80 | 30 days supply | Qty: 30 | Fill #0

## 2019-02-18 MED FILL — CLONAZEPAM 1 MG TABS: 1 | 30 days supply | Qty: 90 | Fill #0

## 2019-02-18 MED FILL — DIVALPROEX SOD ER 500 MG TA: 500 | 30 days supply | Qty: 60 | Fill #0

## 2019-02-18 NOTE — Progress Notes (Signed)
Social Work Discharge Note   The overall goal for the admission was met for:   Discharge location: Yes - home with mother and girlfriend to provide 24/7 supervision  Length of Stay: Yes - 13 days  Discharge activity level: Yes - supervision  Home/community participation: Yes  Services provided included: MD, RD, PT, OT, SLP, RN, TR, Pharmacy, Neuropsych and SW  Financial Services: Medicaid application pending  Follow-up services arranged: Outpatient: [facility] via [facility] and Patient/Family has no preference for HH/DME agencies  Comments (or additional information):    Contact info:  Pt's mother, Mariana Kaufman @ [phone]  Patient/Family verbalized understanding of follow-up arrangements: Yes  Individual responsible for coordination of the follow-up plan: mother  Confirmed correct DME delivered:  NA - none needed.    Zaara Sprowl
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
## 2019-02-18 NOTE — Progress Notes (Signed)
Pt discharge home with family. Discharge instructions given by Jeannene Patella, PA. No further questions from pt or family. Belongings sent with pt. No complaints at time of discharge. Gerald Stabs, RN
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
## 2019-02-18 NOTE — Discharge Instructions (Signed)
Inpatient Rehab Discharge Instructions  Billy Malone [facility] Discharge date and time:  02/17/19  Activities/Precautions/ Functional Status: Activity: no lifting, driving, or strenuous exercise till cleared by MD Diet: regular diet Wound Care: keep wound clean and dry   Functional status:  ___ No restrictions     ___ Walk up steps independently _X__ 24/7 supervision/assistance   ___ Walk up steps with assistance ___ Intermittent supervision/assistance  ___ Bathe/dress independently ___ Walk with walker     ___ Bathe/dress with assistance ___ Walk Independently    ___ Shower independently ___ Walk with assistance    ___ Shower with assistance _X__ No alcohol     ___ Return to work/school ________     COMMUNITY REFERRALS UPON DISCHARGE:    Outpatient:   Speech Therapy             Agency:  [facility] Phone: [phone]              Appointment Date/Time:  They will call mom directly with appointment time   GENERAL COMMUNITY RESOURCES FOR PATIENT/FAMILY: Support Groups:  See handouts       Special Instructions: 1. Mother to manage and administer medications.   FOR SAFETY, 24 HOUR SUPERVISION IS RECOMMENDED UNTIL OTHERWISE DIRECTED BY A PHYSICIAN.  ABSOLUTELY NO DRIVING UNTIL OTHERWISE DIRECTED BY A PHYSICIAN. CAR KEYS SHOULD BE HIDDEN IN A SAFE LOCATION IF NEEDED.  DUE TO RISK OF INJURY, PLEASE REMOVE GUNS, KNIVES, OR ANY OTHER POTENTIALLY DANGEROUS OBJECTS AND HAZARDOUS MATERIALS FROM THE HOME.     My questions have been answered and I understand these instructions. I will adhere to these goals and the provided educational materials after my discharge from the hospital.  Patient/Caregiver Signature _______________________________ Date __________  Clinician Signature _______________________________________ Date __________  Please bring this form and your medication list with you to all your follow-up doctor's appointments.

## 2019-02-18 NOTE — Progress Notes (Signed)
Speech Language Pathology Discharge Summary  Patient Details  Name: Shreyan Hinz MRN: [id number] Date of Birth: 1996-06-06  Patient has met 3 of 5 long term goals.  Patient to discharge at overall Max level.   Reasons goals not met: Patient continues to require total A for recall of functional information and intellectual awareness of deficits   Clinical Impression/Discharge Summary: Patient continues to demonstrate behaviors consistent with a Rancho Level V and requires Max-Total A to complete functional and familiar tasks safely in regards to orientation, recall and awareness. However, patient demonstrates improved attention and basic problem solving with tasks. Patient continues to demonstrate intermittent agitation, inappropriate language and language of confusion but can be redirected at times. Family education is complete and patient will discharge home with 24 hour supervision from family. Patient would benefit from f/u SLP services to maximize his cognitive functioning and overall functional independence in order to reduce caregiver burden.   Care Partner:  Caregiver Able to Provide Assistance: Yes  Type of Caregiver Assistance: Cognitive  Recommendation:  Home Health SLP;24 hour supervision/assistance  Rationale for SLP Follow Up: Maximize cognitive function and independence;Reduce caregiver burden   Equipment: N/A   Reasons for discharge: Discharged from hospital   Patient/Family Agrees with Progress Made and Goals Achieved: Yes    Parker, [place] 02/18/2019, 6:38 AM

## 2019-02-18 NOTE — Plan of Care (Signed)
  Problem: Consults Goal: [facility] BRAIN INJURY PATIENT EDUCATION Description: Description: See Patient Education module for eduction specifics 02/18/2019 1323 by Gerald Stabs, RN Outcome: Completed/Met 02/18/2019 1323 by Gerald Stabs, RN Reactivated Goal: Skin Care Protocol Initiated - if Braden Score 18 or less Description: If consults are not indicated, leave blank or document N/A 02/18/2019 1323 by Gerald Stabs, RN Outcome: Completed/Met 02/18/2019 1323 by Gerald Stabs, RN Reactivated   Problem: RH BOWEL ELIMINATION Goal: RH STG MANAGE BOWEL WITH ASSISTANCE Description: STG Manage Bowel with mod I Assistance. 02/18/2019 1323 by Gerald Stabs, RN Outcome: Completed/Met 02/18/2019 1323 by Gerald Stabs, RN Reactivated Goal: RH STG MANAGE BOWEL W/MEDICATION W/ASSISTANCE Description: STG Manage Bowel with Medication with mod I Assistance. 02/18/2019 1323 by Gerald Stabs, RN Outcome: Completed/Met 02/18/2019 1323 by Gerald Stabs, RN Reactivated   Problem: RH SAFETY Goal: RH STG ADHERE TO SAFETY PRECAUTIONS W/ASSISTANCE/DEVICE Description: STG Adhere to Safety Precautions With min/mod Assistance/Device. 02/18/2019 1323 by Gerald Stabs, RN Outcome: Completed/Met 02/18/2019 1323 by Gerald Stabs, RN Reactivated   Problem: RH COGNITION-NURSING Goal: RH STG USES MEMORY AIDS/STRATEGIES W/ASSIST TO PROBLEM SOLVE Description: STG Uses Memory Aids/Strategies With min/mod Assistance to Problem Solve. 02/18/2019 1323 by Gerald Stabs, RN Outcome: Completed/Met 02/18/2019 1323 by Gerald Stabs, RN Reactivated   Problem: RH KNOWLEDGE DEFICIT BRAIN INJURY Goal: RH STG INCREASE KNOWLEDGE OF SELF CARE AFTER BRAIN INJURY Description: Patient/caregiver will verbalize understanding of brain injury recovery including medications, safety, stages of recovery, etc. With min/mod assist. 02/18/2019 1323 by Gerald Stabs, RN Outcome: Completed/Met 02/18/2019 1323 by Gerald Stabs,  RN Reactivated

## 2019-02-18 NOTE — Progress Notes (Addendum)
Right at shift change, patient was noted walking toward the doors to the unit. He was stopped by staff & redirected. He stated that he had to go to work & that they were waiting for him. He said that he was called by his co-worker to come in, the previous nurse reminded patient that his mother had his phone & that he was in the hospital. Patient was fixated on leaving & wanted to know why we could not let him leave. He began to get loud & cursed multiple times stating that he didn't have to stay here. "For what?" he stated, I'm not doing anything," He was redirected by the nurse tech to come with her. The nurse techs were taking turns monitoring him & using various distraction methods. Right after shift report, the nurse techs became busy & he was noted at the door. He was redirected again, reoriented, but he still stated that he had to go to work under the impression that he will come back. The nurse tech came to get him again & when he was in his room, his night time medication were given. He stated that this nurse was trying to overdose him again, after asking what the medication was. He walked around some more & eventually went to his room to lay down. At 2100, he was still awake. When he went to sleep, he slept the whole night. At this time he is still asleep. No distress noted. Will continue to monitor

## 2019-02-21 ENCOUNTER — Telehealth: Payer: Self-pay

## 2019-02-21 NOTE — Telephone Encounter (Signed)
I spoke to Mom. Needs to hide keys. She needs to make sure he takes medications as prescribed. I suggested some other ideas as well.
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
## 2019-02-21 NOTE — Telephone Encounter (Signed)
Transitional Care call-mother [facility]    1. Are you/is patient experiencing any problems since coming home? No Are there any questions regarding any aspect of care? No 2. Are there any questions regarding medications administration/dosing? No Are meds being taken as prescribed? Yes Patient should review meds with caller to confirm 3. Have there been any falls? No 4. Has Home Health been to the house and/or have they contacted you? Outpatient Rehab for ST If not, have you tried to contact them? Can we help you contact them? 5. Are bowels and bladder emptying properly? Yes Are there any unexpected incontinence issues? No If applicable, is patient following bowel/bladder programs? 6. Any fevers, problems with breathing, unexpected pain? No 7. Are there any skin problems or new areas of breakdown? No 8. Has the patient/family member arranged specialty MD follow up (ie cardiology/neurology/renal/surgical/etc)? Yes  Can we help arrange? 9. Does the patient need any other services or support that we can help arrange?  No 10. Are caregivers following through as expected in assisting the patient? Yes 11. Has the patient quit smoking, drinking alcohol, or using drugs as recommended? No, still doing all  Appointment time 8:40 am arrive time 8:20 am on 03/05/2019 with Dr. Naaman Plummer [address] [phone]

## 2019-02-25 ENCOUNTER — Ambulatory Visit (HOSPITAL_COMMUNITY): Payer: BLUE CROSS/BLUE SHIELD | Attending: Speech Pathology | Admitting: Speech Pathology

## 2019-02-25 ENCOUNTER — Encounter (HOSPITAL_COMMUNITY): Payer: Self-pay

## 2019-02-27 ENCOUNTER — Encounter: Payer: Self-pay | Admitting: Physical Medicine and Rehabilitation

## 2019-03-05 ENCOUNTER — Encounter: Payer: Self-pay | Admitting: Physical Medicine & Rehabilitation

## 2019-03-05 ENCOUNTER — Encounter: Payer: Medicaid Other | Attending: Physical Medicine and Rehabilitation | Admitting: Physical Medicine & Rehabilitation

## 2019-03-05 ENCOUNTER — Other Ambulatory Visit: Payer: Self-pay

## 2019-03-05 VITALS — BP 109/71 | HR 90 | Temp 98.2°F | Ht 72.0 in | Wt 164.0 lb

## 2019-03-05 DIAGNOSIS — S069X3S Unspecified intracranial injury with loss of consciousness of 1 hour to 5 hours 59 minutes, sequela: Secondary | ICD-10-CM | POA: Diagnosis not present

## 2019-03-05 DIAGNOSIS — R4689 Other symptoms and signs involving appearance and behavior: Secondary | ICD-10-CM | POA: Diagnosis not present

## 2019-03-05 DIAGNOSIS — S069X0S Unspecified intracranial injury without loss of consciousness, sequela: Secondary | ICD-10-CM | POA: Diagnosis not present

## 2019-03-05 MED ORDER — QUETIAPINE FUMARATE 50 MG PO TABS: ORAL_TABLET | ORAL | 1 refills | Status: DC

## 2019-03-05 MED ORDER — PROPRANOLOL HCL ER 80 MG PO CP24: 80.0000 mg | ORAL_CAPSULE | Freq: Every day | ORAL | 1 refills | Status: DC

## 2019-03-05 NOTE — Patient Instructions (Addendum)
PLEASE FEEL FREE TO CALL OUR OFFICE WITH ANY PROBLEMS OR QUESTIONS ([phone])   Today cut depakote to 500mg  daily Over the next couple weeks, if no seizures and behavior is ok, you can stop the depakote
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
## 2019-03-18 ENCOUNTER — Ambulatory Visit (HOSPITAL_COMMUNITY): Payer: Medicaid Other | Attending: Physical Medicine and Rehabilitation | Admitting: Speech Pathology

## 2019-03-18 ENCOUNTER — Encounter (HOSPITAL_COMMUNITY): Payer: Self-pay | Admitting: Speech Pathology

## 2019-03-18 ENCOUNTER — Other Ambulatory Visit: Payer: Self-pay

## 2019-03-18 DIAGNOSIS — R41841 Cognitive communication deficit: Secondary | ICD-10-CM | POA: Diagnosis not present
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
## 2019-03-19 ENCOUNTER — Other Ambulatory Visit: Payer: Self-pay

## 2019-03-25 ENCOUNTER — Telehealth (HOSPITAL_COMMUNITY): Payer: Self-pay | Admitting: Speech Pathology

## 2019-03-25 NOTE — Telephone Encounter (Signed)
Called to schedule 3 visit per Dabney.  Mom was in the car and requested I call her back after lunch. Izora Gala

## 2019-03-27 ENCOUNTER — Encounter (HOSPITAL_COMMUNITY): Payer: Self-pay | Admitting: Speech Pathology

## 2019-03-27 ENCOUNTER — Telehealth: Payer: Self-pay | Admitting: *Deleted

## 2019-03-27 ENCOUNTER — Ambulatory Visit (HOSPITAL_COMMUNITY): Payer: Medicaid Other | Admitting: Speech Pathology

## 2019-03-27 ENCOUNTER — Other Ambulatory Visit: Payer: Self-pay

## 2019-03-27 DIAGNOSIS — R41841 Cognitive communication deficit: Secondary | ICD-10-CM

## 2019-03-27 NOTE — Telephone Encounter (Signed)
Patients mother called stating that he has 1 tablet of  Klonopin left. Asking for a refill.
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
## 2019-03-28 MED ORDER — CLONAZEPAM 1 MG PO TABS: 1.0000 mg | ORAL_TABLET | Freq: Three times a day (TID) | ORAL | 0 refills | Status: DC | PRN

## 2019-03-28 NOTE — Telephone Encounter (Signed)
rx written. However, I want him to work on weaning off of this

## 2019-03-28 NOTE — Telephone Encounter (Signed)
I conferred with Dr. Naaman Plummer.  He stated that he would like the patient to take klonopin BID until nex follow up.  I contacted patients mother and informed that medication was refilled.  I stated that Dr. Naaman Plummer wants patient to take klonopin BID despite the sig stating TID.  Patients mother verbalized understanding.

## 2019-03-31 ENCOUNTER — Ambulatory Visit (HOSPITAL_COMMUNITY): Payer: Medicaid Other | Admitting: Speech Pathology

## 2019-03-31 ENCOUNTER — Telehealth (HOSPITAL_COMMUNITY): Payer: Self-pay | Admitting: Speech Pathology

## 2019-03-31 NOTE — Telephone Encounter (Signed)
pt's mom called to cancel this appt message was on the vm system mom said appts are to early needs later appt times. Called and lmonvm for a return call from mom.

## 2019-04-01 DIAGNOSIS — F4320 Adjustment disorder, unspecified: Secondary | ICD-10-CM | POA: Diagnosis not present

## 2019-04-02 ENCOUNTER — Encounter: Payer: Medicaid Other | Attending: Physical Medicine and Rehabilitation | Admitting: Physical Medicine & Rehabilitation

## 2019-04-02 ENCOUNTER — Encounter: Payer: Self-pay | Admitting: Physical Medicine & Rehabilitation

## 2019-04-02 ENCOUNTER — Ambulatory Visit
Admission: RE | Admit: 2019-04-02 | Discharge: 2019-04-02 | Disposition: A | Discharge status: Home / Self Care | Payer: Medicaid Other | Source: Ambulatory Visit | Attending: Physical Medicine & Rehabilitation | Admitting: Physical Medicine & Rehabilitation

## 2019-04-02 ENCOUNTER — Other Ambulatory Visit: Payer: Self-pay

## 2019-04-02 VITALS — BP 115/71 | HR 89 | Temp 97.7°F | Ht 72.0 in | Wt 178.0 lb

## 2019-04-02 DIAGNOSIS — M25551 Pain in right hip: Secondary | ICD-10-CM

## 2019-04-02 DIAGNOSIS — M5441 Lumbago with sciatica, right side: Secondary | ICD-10-CM

## 2019-04-02 DIAGNOSIS — S3992XA Unspecified injury of lower back, initial encounter: Secondary | ICD-10-CM | POA: Diagnosis not present

## 2019-04-02 DIAGNOSIS — S069X3S Unspecified intracranial injury with loss of consciousness of 1 hour to 5 hours 59 minutes, sequela: Secondary | ICD-10-CM | POA: Insufficient documentation

## 2019-04-02 DIAGNOSIS — M545 Low back pain: Secondary | ICD-10-CM | POA: Diagnosis not present

## 2019-04-02 DIAGNOSIS — S069X0S Unspecified intracranial injury without loss of consciousness, sequela: Secondary | ICD-10-CM | POA: Insufficient documentation

## 2019-04-02 DIAGNOSIS — M5442 Lumbago with sciatica, left side: Secondary | ICD-10-CM

## 2019-04-02 DIAGNOSIS — R4689 Other symptoms and signs involving appearance and behavior: Secondary | ICD-10-CM

## 2019-04-02 DIAGNOSIS — S3993XA Unspecified injury of pelvis, initial encounter: Secondary | ICD-10-CM | POA: Diagnosis not present

## 2019-04-02 MED ORDER — DICLOFENAC SODIUM 50 MG PO TBEC: 50.0000 mg | DELAYED_RELEASE_TABLET | Freq: Two times a day (BID) | ORAL | 3 refills | Status: DC

## 2019-04-02 NOTE — Patient Instructions (Addendum)
PLEASE FEEL FREE TO CALL OUR OFFICE WITH ANY PROBLEMS OR QUESTIONS ([phone])  Reduce klonopin to bedtime only until it's gone.   LET'S WORK ON A SCHEDULE AND IMPROVING YOUR ORGANIZATION

## 2019-04-02 NOTE — Progress Notes (Signed)
Subjective:    Patient ID: Billy Malone, male    DOB: December 17, 1995, 23 y.o.   MRN: [id number]  HPI Billy Malone is here in follow up of his traumatic brain injury. He states he has just been sitting around watching TV. He fishes sometimes. His sleep is interrupted because of low back pain.   Mom feels that he is doing better but that he still has some confusion and that she is hesitant to leave him alone.  He is in the house with his cousins and his sister.  Mother wants to go back to work ultimately.  He has been able to cut his Klonopin down to twice daily without any major issues.  Mom does find that does helped calm him down when he is wound up.  He remains on Seroquel as prescribed.  Depakote has been weaned off.  He still takes propranolol.  His biggest complaint today is right-sided low back and hip pain.  Seems to be getting worse the farther he is out from discharge.  I did review some of his imaging from the hospital which include CTs of the pelvis neck and abdomen.  There is no other apparent fractures identified.  He sleeps 3 to 4 hours at night because of the pain which is worse when he lies on that right side or sometimes his low back.  . Pain Inventory Average Pain 5 Pain Right Now 5 My pain is sharp and aching  In the last 24 hours, has pain interfered with the following? General activity 2 Relation with others 2 Enjoyment of life 2 What TIME of day is your pain at its worst? all Sleep (in general) Fair  Pain is worse with: bending and standing Pain improves with: medication Relief from Meds: 2  Mobility walk without assistance ability to climb steps?  yes do you drive?  no  Function not employed: date last employed . I need assistance with the following:  meal prep and household duties  Neuro/Psych weakness tingling dizziness confusion loss of taste or smell  Prior Studies Any changes since last visit?  no  Physicians involved in your care Any  changes since last visit?  no   Family History  Problem Relation Age of Onset  . Hypertension Maternal Grandmother   . Diabetes Maternal Grandmother   . Hypertension Maternal Grandfather   . Diabetes Maternal Grandfather    Social History   Socioeconomic History  . Marital status: Single    Spouse name: Not on file  . Number of children: Not on file  . Years of education: Not on file  . Highest education level: Not on file  Occupational History  . Not on file  Social Needs  . Financial resource strain: Not on file  . Food insecurity    Worry: Not on file    Inability: Not on file  . Transportation needs    Medical: Not on file    Non-medical: Not on file  Tobacco Use  . Smoking status: Current Some Day Smoker    Packs/day: 1.00  . Smokeless tobacco: Never Used  Substance and Sexual Activity  . Alcohol use: Not Currently  . Drug use: Yes    Types: Marijuana, Benzodiazepines  . Sexual activity: Yes  Lifestyle  . Physical activity    Days per week: Not on file    Minutes per session: Not on file  . Stress: Not on file  Relationships  . Social Herbalist on  phone: Not on file    Gets together: Not on file    Attends religious service: Not on file    Active member of club or organization: Not on file    Attends meetings of clubs or organizations: Not on file    Relationship status: Not on file  Other Topics Concern  . Not on file  Social History Narrative   ** Merged History Encounter **       Past Surgical History:  Procedure Laterality Date  . APPENDECTOMY    . TYMPANOSTOMY TUBE PLACEMENT    . WISDOM TOOTH EXTRACTION     History reviewed. No pertinent past medical history. BP 115/71   Pulse 89   Temp 97.7 F (36.5 C)   Ht 6' (1.829 m)   Wt 178 lb (80.7 kg)   SpO2 98%   BMI 24.14 kg/m   Opioid Risk Score:   Fall Risk Score:  `1  Depression screen PHQ 2/9  No flowsheet data found.  Review of Systems  Constitutional: Negative.    HENT: Positive for hearing loss.   Eyes: Negative.   Respiratory: Negative.   Cardiovascular: Negative.   Gastrointestinal: Positive for abdominal pain and constipation.  Endocrine: Negative.   Genitourinary: Negative.   Musculoskeletal: Negative.   Skin: Negative.   Allergic/Immunologic: Negative.   Neurological: Positive for dizziness.       Tingling  Hematological: Negative.   Psychiatric/Behavioral: Positive for confusion.  All other systems reviewed and are negative.      Objective:   Physical Exam Gen: no distress, normal appearing HEENT: oral mucosa pink and moist, NCAT Cardio: Reg rate Chest: normal effort, normal rate of breathing Abd: soft, non-distended Ext: no edema Skin: intact, tattoos Neuro: oriented to person, place.  Remains distracted but more calm overall.  More redirectable.  Displaying better insight and awareness.. Strength 5/5,  Sensory nl. Short term memory deficits Musculoskeletal: He has pain with palpation of the right PSIS area.  There is mild pain with palpation over the right greater trochanter.  It is uncomfortable for him to bend forward past about 45 degrees.  Extension only causes mild pain.  Minimal pain with rotation and lateral bending.  There was some spasms in his lumbar paraspinals right more than left.  There is no gross asymmetries.  No obvious antalgia on the right. Psych: cooperative , impulsive, irritable at times        Assessment & Plan:  1.Imapired function/ADLs/mobility/cognitionsecondary toTBI, depressed skull fx with [employer] Level VII            -ongoing cognitive progress            -likely some premorbid impulsiveness/behavioral issues  -Challenged patient and mother to work on increasing independence with home activities.  He did start working on management of his medications.  I do think he can be left alone at least for a little while initially.  As he merits it he could be home more alone.   2.  Antithrombotics: -DVT/anticoagulation:Pharmaceutical:Lovenox -antiplatelet therapy: N/A 3. Pain Management:We will check x-rays of the lumbar spine and right hip.  -Prescription provided for diclofenac 50 mg twice daily  -Again CT scans from the hospital were reviewed and really unremarkable  Exam most consistent with a right SI joint problem.  4. Mood:   - sleep schedule should improve with better pain control - Propranolol inderal xl 80mg   -prn klonopin only for severe agitation---weaning to off.  -Off of Depakote   -  Continue Seroquel  Thirty minutes of face to face patient care time were spent during this visit. All questions were encouraged and answered. Follow up with me in about 2 months

## 2019-04-03 ENCOUNTER — Telehealth: Payer: Self-pay | Admitting: *Deleted

## 2019-04-03 ENCOUNTER — Encounter (HOSPITAL_COMMUNITY): Payer: Medicaid Other | Admitting: Speech Pathology

## 2019-04-03 NOTE — Telephone Encounter (Signed)
Prior authorization for diclofenac sodium tablets submitted to [facility] [employer]. Awaiting decision

## 2019-04-04 NOTE — Telephone Encounter (Signed)
Prior auth for diclofenac sodium tablets Status: APPROVED  Effective Begin Date:04/04/2019  Effective End Date:03/29/2020 Confirmation #:[id number] F.  Patient and pharmacy notified.

## 2019-04-08 ENCOUNTER — Telehealth (HOSPITAL_COMMUNITY): Payer: Self-pay | Admitting: Physical Medicine & Rehabilitation

## 2019-04-08 NOTE — Telephone Encounter (Signed)
xrays of lumbar spine and hips reviewed---within normal limits. No acute injuries. Please let pt/mom know.  thx

## 2019-04-09 ENCOUNTER — Encounter (HOSPITAL_COMMUNITY): Payer: Self-pay | Admitting: Speech Pathology

## 2019-04-09 ENCOUNTER — Ambulatory Visit (HOSPITAL_COMMUNITY): Payer: Medicaid Other | Admitting: Speech Pathology

## 2019-04-09 ENCOUNTER — Encounter (HOSPITAL_COMMUNITY): Payer: Medicaid Other | Admitting: Speech Pathology

## 2019-04-09 ENCOUNTER — Other Ambulatory Visit: Payer: Self-pay

## 2019-04-09 DIAGNOSIS — R41841 Cognitive communication deficit: Secondary | ICD-10-CM
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
## 2019-04-10 NOTE — Telephone Encounter (Signed)
Mother notified

## 2019-04-16 ENCOUNTER — Ambulatory Visit (HOSPITAL_COMMUNITY): Payer: Medicaid Other | Attending: Physical Medicine and Rehabilitation | Admitting: Speech Pathology

## 2019-04-16 DIAGNOSIS — R41841 Cognitive communication deficit: Secondary | ICD-10-CM | POA: Insufficient documentation

## 2019-04-17 ENCOUNTER — Ambulatory Visit (INDEPENDENT_AMBULATORY_CARE_PROVIDER_SITE_OTHER): Payer: Medicaid Other | Admitting: Otolaryngology

## 2019-04-18 ENCOUNTER — Other Ambulatory Visit: Payer: Self-pay | Admitting: Physical Medicine & Rehabilitation

## 2019-04-22 ENCOUNTER — Other Ambulatory Visit: Payer: Self-pay

## 2019-04-22 ENCOUNTER — Encounter (HOSPITAL_COMMUNITY): Payer: Self-pay | Admitting: Speech Pathology

## 2019-04-22 ENCOUNTER — Ambulatory Visit (HOSPITAL_COMMUNITY): Payer: Medicaid Other | Admitting: Speech Pathology

## 2019-04-22 DIAGNOSIS — R41841 Cognitive communication deficit: Secondary | ICD-10-CM
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
## 2019-04-30 ENCOUNTER — Ambulatory Visit: Payer: Self-pay | Admitting: Physical Medicine & Rehabilitation

## 2019-06-11 ENCOUNTER — Encounter: Payer: Medicaid Other | Attending: Physical Medicine and Rehabilitation | Admitting: Physical Medicine & Rehabilitation

## 2019-06-11 DIAGNOSIS — R4689 Other symptoms and signs involving appearance and behavior: Secondary | ICD-10-CM | POA: Insufficient documentation

## 2019-06-11 DIAGNOSIS — S069X0S Unspecified intracranial injury without loss of consciousness, sequela: Secondary | ICD-10-CM | POA: Insufficient documentation

## 2019-06-11 DIAGNOSIS — S069X3S Unspecified intracranial injury with loss of consciousness of 1 hour to 5 hours 59 minutes, sequela: Secondary | ICD-10-CM | POA: Insufficient documentation

## 2019-07-29 DIAGNOSIS — J343 Hypertrophy of nasal turbinates: Secondary | ICD-10-CM | POA: Diagnosis not present

## 2019-07-29 DIAGNOSIS — H9042 Sensorineural hearing loss, unilateral, left ear, with unrestricted hearing on the contralateral side: Secondary | ICD-10-CM | POA: Diagnosis not present

## 2019-07-29 DIAGNOSIS — R430 Anosmia: Secondary | ICD-10-CM | POA: Diagnosis not present

## 2019-07-29 DIAGNOSIS — H6123 Impacted cerumen, bilateral: Secondary | ICD-10-CM | POA: Diagnosis not present

## 2019-08-06 ENCOUNTER — Other Ambulatory Visit: Payer: Self-pay

## 2019-08-06 ENCOUNTER — Encounter: Payer: Self-pay | Admitting: Physical Medicine & Rehabilitation

## 2019-08-06 ENCOUNTER — Encounter: Payer: Medicaid Other | Attending: Physical Medicine and Rehabilitation | Admitting: Physical Medicine & Rehabilitation

## 2019-08-06 VITALS — BP 123/78 | HR 78 | Temp 97.7°F | Ht 72.0 in | Wt 176.0 lb

## 2019-08-06 DIAGNOSIS — G3189 Other specified degenerative diseases of nervous system: Secondary | ICD-10-CM | POA: Diagnosis not present

## 2019-08-06 DIAGNOSIS — S069X3S Unspecified intracranial injury with loss of consciousness of 1 hour to 5 hours 59 minutes, sequela: Secondary | ICD-10-CM | POA: Diagnosis not present

## 2019-08-06 DIAGNOSIS — G44329 Chronic post-traumatic headache, not intractable: Secondary | ICD-10-CM | POA: Diagnosis not present

## 2019-08-06 DIAGNOSIS — F09 Unspecified mental disorder due to known physiological condition: Secondary | ICD-10-CM | POA: Insufficient documentation

## 2019-08-06 DIAGNOSIS — S069X0S Unspecified intracranial injury without loss of consciousness, sequela: Secondary | ICD-10-CM | POA: Diagnosis not present

## 2019-08-06 DIAGNOSIS — R4689 Other symptoms and signs involving appearance and behavior: Secondary | ICD-10-CM | POA: Diagnosis not present

## 2019-08-06 DIAGNOSIS — S069X9S Unspecified intracranial injury with loss of consciousness of unspecified duration, sequela: Secondary | ICD-10-CM

## 2019-08-06 MED ORDER — DIVALPROEX SODIUM 250 MG PO DR TAB: 250.0000 mg | DELAYED_RELEASE_TABLET | Freq: Two times a day (BID) | ORAL | 2 refills | Status: DC

## 2019-08-06 MED ORDER — PROPRANOLOL HCL ER 80 MG PO CP24: 80.0000 mg | ORAL_CAPSULE | Freq: Every day | ORAL | 5 refills | Status: DC

## 2019-08-06 MED ORDER — METHYLPHENIDATE HCL 5 MG PO TABS: 5.0000 mg | ORAL_TABLET | Freq: Every day | ORAL | 0 refills | Status: DC

## 2019-08-06 NOTE — Patient Instructions (Signed)
PLEASE FEEL FREE TO CALL OUR OFFICE WITH ANY PROBLEMS OR QUESTIONS ([phone])      

## 2019-08-06 NOTE — Progress Notes (Signed)
Subjective:    Patient ID: Billy Malone, male    DOB: [birth year], 24 y.o.   MRN: [id number]  HPI   Billy Malone is here in follow up of his TBI. Mom says he stays at home a lot and doesn't seem to be motivated to do much. Mom reports his sleep can be broken. Billy Malone says he sleeps every night and typically feels rested when he wakes up.  Mom says that she tries to make things easy for him at home such as prepping meals having microwavable food etc.  However Billy Malone frequently will not initiate eating on his own.  He does not do much else besides sit around.  He has done anything unsafe however.  He complains of an ongoing headache which he describes as floating and up at the top of his ahead in the posterior frontal areas.  He takes tylenol and ibuprofen for pain which provide some relief.  He states that the headache was worse than ever last night. He ran out of propranolol two days ago.   He reports some anxiety at times but denies depression. He visits with friends once per month.      Pain Inventory Average Pain 6 Pain Right Now 0 My pain is aching and tight  In the last 24 hours, has pain interfered with the following? General activity 0 Relation with others 0 Enjoyment of life 0 What TIME of day is your pain at its worst? same all the day Sleep (in general) Fair  Pain is worse with: na Pain improves with: medication Relief from Meds: 2  Mobility walk without assistance ability to climb steps?  yes do you drive?  no  Function disabled: date disabled 2021 I need assistance with the following:  meal prep, household duties and shopping  Neuro/Psych dizziness confusion depression anxiety  Prior Studies Any changes since last visit?  no  Physicians involved in your care Any changes since last visit?  no   Family History  Problem Relation Age of Onset  . Hypertension Maternal Grandmother   . Diabetes Maternal Grandmother   . Hypertension Maternal Grandfather   .  Diabetes Maternal Grandfather    Social History   Socioeconomic History  . Marital status: Single    Spouse name: Not on file  . Number of children: Not on file  . Years of education: Not on file  . Highest education level: Not on file  Occupational History  . Not on file  Tobacco Use  . Smoking status: Current Some Day Smoker    Packs/day: 1.00  . Smokeless tobacco: Never Used  Substance and Sexual Activity  . Alcohol use: Not Currently  . Drug use: Yes    Types: Marijuana, Benzodiazepines  . Sexual activity: Yes  Other Topics Concern  . Not on file  Social History Narrative   ** Merged History Encounter **       Social Determinants of Health   Financial Resource Strain:   . Difficulty of Paying Living Expenses: Not on file  Food Insecurity:   . Worried About Programme researcher, broadcasting/film/video in the Last Year: Not on file  . Ran Out of Food in the Last Year: Not on file  Transportation Needs:   . Lack of Transportation (Medical): Not on file  . Lack of Transportation (Non-Medical): Not on file  Physical Activity:   . Days of Exercise per Week: Not on file  . Minutes of Exercise per Session: Not on file  Stress:   .  Feeling of Stress : Not on file  Social Connections:   . Frequency of Communication with Friends and Family: Not on file  . Frequency of Social Gatherings with Friends and Family: Not on file  . Attends Religious Services: Not on file  . Active Member of Clubs or Organizations: Not on file  . Attends Archivist Meetings: Not on file  . Marital Status: Not on file   Past Surgical History:  Procedure Laterality Date  . APPENDECTOMY    . TYMPANOSTOMY TUBE PLACEMENT    . WISDOM TOOTH EXTRACTION     No past medical history on file. There were no vitals taken for this visit.  Opioid Risk Score:   Fall Risk Score:  `1  Depression screen PHQ 2/9  No flowsheet data found.   Review of Systems  Constitutional: Negative.   HENT: Negative.   Eyes:  Negative.   Respiratory: Negative.   Cardiovascular: Negative.   Gastrointestinal: Negative.   Endocrine: Negative.   Genitourinary: Negative.   Musculoskeletal: Negative.   Skin: Negative.   Allergic/Immunologic: Negative.   Neurological: Positive for dizziness and headaches.  Hematological: Negative.   Psychiatric/Behavioral: Positive for confusion and dysphoric mood. The patient is nervous/anxious.   All other systems reviewed and are negative.      Objective:   Physical Exam General: No acute distress HEENT: EOMI, oral membranes moist Cards: reg rate  Chest: normal effort Abdomen: Soft, NT, ND Skin: dry, intact Extremities: no edema Neuro:oriented to person, place.  Remains distracted but very calm overall.    He is redirectable.    Patient displays insight and awareness to a certain extent but is not yet on basic concepts.  I asked him several personal questions and questions that required some detail and answering and he is unable to do so.. Strength 5/5, Sensory nl. Short term memory deficits Musculoskeletal: mild LBP Psych:cooperative but flat. Minimal engagement.  Slow processing and impaired decision-making    Assessment & Plan:  1.Imapired function/ADLs/mobility/cognitionsecondary toTBI, depressed skull fx with [facility] Level VII -ongoing cognitive progress -persistent anosmia  2. Antithrombotics: -DVT/anticoagulation:Pharmaceutical:Lovenox -antiplatelet therapy: N/A 3. Pain Management: headaches are ongoing, more prominent since missing propranolol doses  -Xrays negative  -resume depakote for headache treatment. 250mg  bid  -Resume propranolol for headaches 4. Mood:  -off seroquel.  And Klonopin - Propranolol inderal xl 80mg   - trial of ritalin 5mg  daily for initiation and arousal, mood   -Consider antidepressant  25 minutes of face to face patient care time were  spent during this visit. All questions were encouraged and answered. Follow up with me inabout 2 months

## 2019-08-14 ENCOUNTER — Telehealth: Payer: Self-pay | Admitting: Physical Medicine & Rehabilitation

## 2019-08-14 NOTE — Telephone Encounter (Signed)
When patient was in office in February, a prescription was handed to him for court.  Mom is saying that they need it on letterhead and it needs to be detailed as to why he can't appear in court.  Mom didn't have case number she will call back with that information.

## 2019-08-15 NOTE — Telephone Encounter (Signed)
Called to notify mother and she asked if it could be mailed. Letter mailed.

## 2019-08-15 NOTE — Telephone Encounter (Addendum)
I generated a letter. Someone will have to stamp my signature on it. thx

## 2019-09-03 ENCOUNTER — Encounter: Payer: Self-pay | Admitting: Physical Medicine & Rehabilitation

## 2019-09-03 ENCOUNTER — Other Ambulatory Visit: Payer: Self-pay

## 2019-09-03 ENCOUNTER — Encounter: Payer: Medicaid Other | Attending: Physical Medicine and Rehabilitation | Admitting: Physical Medicine & Rehabilitation

## 2019-09-03 VITALS — BP 120/77 | HR 68 | Temp 97.7°F | Ht 72.0 in | Wt 181.0 lb

## 2019-09-03 DIAGNOSIS — F09 Unspecified mental disorder due to known physiological condition: Secondary | ICD-10-CM | POA: Diagnosis not present

## 2019-09-03 DIAGNOSIS — S069X3S Unspecified intracranial injury with loss of consciousness of 1 hour to 5 hours 59 minutes, sequela: Secondary | ICD-10-CM | POA: Insufficient documentation

## 2019-09-03 DIAGNOSIS — G44329 Chronic post-traumatic headache, not intractable: Secondary | ICD-10-CM | POA: Diagnosis not present

## 2019-09-03 DIAGNOSIS — G894 Chronic pain syndrome: Secondary | ICD-10-CM | POA: Diagnosis not present

## 2019-09-03 DIAGNOSIS — Z79891 Long term (current) use of opiate analgesic: Secondary | ICD-10-CM | POA: Insufficient documentation

## 2019-09-03 DIAGNOSIS — R4689 Other symptoms and signs involving appearance and behavior: Secondary | ICD-10-CM | POA: Insufficient documentation

## 2019-09-03 DIAGNOSIS — S069X0S Unspecified intracranial injury without loss of consciousness, sequela: Secondary | ICD-10-CM | POA: Diagnosis not present

## 2019-09-03 DIAGNOSIS — G3189 Other specified degenerative diseases of nervous system: Secondary | ICD-10-CM | POA: Insufficient documentation

## 2019-09-03 DIAGNOSIS — Z5181 Encounter for therapeutic drug level monitoring: Secondary | ICD-10-CM | POA: Insufficient documentation

## 2019-09-03 DIAGNOSIS — S069X9S Unspecified intracranial injury with loss of consciousness of unspecified duration, sequela: Secondary | ICD-10-CM

## 2019-09-03 DIAGNOSIS — S069XAS Unspecified intracranial injury with loss of consciousness status unknown, sequela: Secondary | ICD-10-CM

## 2019-09-03 MED ORDER — METHYLPHENIDATE HCL 10 MG PO TABS: 10.0000 mg | ORAL_TABLET | Freq: Every day | ORAL | 0 refills | Status: DC

## 2019-09-03 NOTE — Patient Instructions (Signed)
WORK ON ESTABLISH A ROUTINE OF SOME SORT AT HOME.
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
## 2019-09-07 LAB — TOXASSURE SELECT,+ANTIDEPR,UR

## 2019-09-09 ENCOUNTER — Telehealth: Payer: Self-pay

## 2019-09-09 NOTE — Telephone Encounter (Signed)
Called [name]' mom about UDS results. Left VM. For now, no new rx's from me.

## 2019-09-09 NOTE — Telephone Encounter (Signed)
UDS results NOT consistent with medication on file /contract 

## 2019-09-10 NOTE — Telephone Encounter (Signed)
Mom returning Dr. Riley Kill phone call.  She works 2nd shift and she goes in @ 2pm.  Please call her before she goes into work.

## 2019-11-05 ENCOUNTER — Encounter: Payer: Self-pay | Admitting: Physical Medicine & Rehabilitation

## 2019-11-05 ENCOUNTER — Other Ambulatory Visit: Payer: Self-pay

## 2019-11-05 ENCOUNTER — Encounter: Payer: Medicaid Other | Attending: Physical Medicine and Rehabilitation | Admitting: Physical Medicine & Rehabilitation

## 2019-11-05 VITALS — BP 117/73 | HR 76 | Temp 97.7°F | Ht 72.0 in | Wt 184.0 lb

## 2019-11-05 DIAGNOSIS — S069X3S Unspecified intracranial injury with loss of consciousness of 1 hour to 5 hours 59 minutes, sequela: Secondary | ICD-10-CM | POA: Diagnosis not present

## 2019-11-05 DIAGNOSIS — Z79891 Long term (current) use of opiate analgesic: Secondary | ICD-10-CM | POA: Diagnosis not present

## 2019-11-05 DIAGNOSIS — S069X0S Unspecified intracranial injury without loss of consciousness, sequela: Secondary | ICD-10-CM

## 2019-11-05 DIAGNOSIS — G44329 Chronic post-traumatic headache, not intractable: Secondary | ICD-10-CM | POA: Diagnosis not present

## 2019-11-05 DIAGNOSIS — F09 Unspecified mental disorder due to known physiological condition: Secondary | ICD-10-CM

## 2019-11-05 DIAGNOSIS — S069X9S Unspecified intracranial injury with loss of consciousness of unspecified duration, sequela: Secondary | ICD-10-CM | POA: Diagnosis not present

## 2019-11-05 DIAGNOSIS — Z5181 Encounter for therapeutic drug level monitoring: Secondary | ICD-10-CM | POA: Diagnosis not present

## 2019-11-05 DIAGNOSIS — S069XAS Unspecified intracranial injury with loss of consciousness status unknown, sequela: Secondary | ICD-10-CM

## 2019-11-05 DIAGNOSIS — G3189 Other specified degenerative diseases of nervous system: Secondary | ICD-10-CM | POA: Diagnosis not present

## 2019-11-05 DIAGNOSIS — R4689 Other symptoms and signs involving appearance and behavior: Secondary | ICD-10-CM | POA: Insufficient documentation

## 2019-11-05 MED ORDER — PROPRANOLOL HCL ER 80 MG PO CP24: 80.0000 mg | ORAL_CAPSULE | Freq: Every day | ORAL | 5 refills | Status: DC

## 2019-11-05 MED ORDER — DIVALPROEX SODIUM 250 MG PO DR TAB: 250.0000 mg | DELAYED_RELEASE_TABLET | Freq: Two times a day (BID) | ORAL | 2 refills | Status: DC
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
## 2019-11-08 LAB — T4: T4, Total: 9 ug/dL (ref 4.5–12.0)

## 2019-11-08 LAB — TESTOSTERONE, FREE: Testosterone, Free: 5.7 pg/mL — ABNORMAL LOW (ref 9.3–26.5)

## 2019-11-08 LAB — TESTOSTERONE: Testosterone: 419 ng/dL (ref 264–916)

## 2019-11-08 LAB — TSH: TSH: 1.26 u[IU]/mL (ref 0.450–4.500)

## 2019-11-08 LAB — T4, FREE: Free T4: 1.57 ng/dL (ref 0.82–1.77)

## 2019-11-13 ENCOUNTER — Telehealth: Payer: Self-pay

## 2019-11-13 DIAGNOSIS — G44329 Chronic post-traumatic headache, not intractable: Secondary | ICD-10-CM

## 2019-11-13 DIAGNOSIS — S069XAS Unspecified intracranial injury with loss of consciousness status unknown, sequela: Secondary | ICD-10-CM

## 2019-11-13 DIAGNOSIS — S069X0S Unspecified intracranial injury without loss of consciousness, sequela: Secondary | ICD-10-CM

## 2019-11-13 NOTE — Telephone Encounter (Signed)
Mother called and said she is really having problems with Joshus - works 2nd shift - needs phone call please [phone]

## 2019-11-14 MED ORDER — DIVALPROEX SODIUM 500 MG PO DR TAB: 500.0000 mg | DELAYED_RELEASE_TABLET | Freq: Two times a day (BID) | ORAL | 2 refills | Status: DC

## 2019-11-14 NOTE — Telephone Encounter (Signed)
Called and spoke with mom. Increased depakote to 500mg  bid and made referral to neuropsych for counseling

## 2019-11-24 ENCOUNTER — Encounter: Payer: Self-pay | Admitting: Psychology

## 2019-12-04 ENCOUNTER — Encounter (HOSPITAL_COMMUNITY): Payer: Self-pay | Admitting: Emergency Medicine

## 2019-12-04 ENCOUNTER — Emergency Department (HOSPITAL_COMMUNITY): Payer: Medicaid Other

## 2019-12-04 ENCOUNTER — Other Ambulatory Visit: Payer: Self-pay

## 2019-12-04 ENCOUNTER — Emergency Department (HOSPITAL_COMMUNITY)
Admission: EM | Admit: 2019-12-04 | Discharge: 2019-12-04 | Disposition: A | Discharge status: Home / Self Care | Payer: Medicaid Other | Attending: Emergency Medicine | Admitting: Emergency Medicine

## 2019-12-04 DIAGNOSIS — G44219 Episodic tension-type headache, not intractable: Secondary | ICD-10-CM | POA: Insufficient documentation

## 2019-12-04 DIAGNOSIS — Z87820 Personal history of traumatic brain injury: Secondary | ICD-10-CM | POA: Diagnosis not present

## 2019-12-04 DIAGNOSIS — F17200 Nicotine dependence, unspecified, uncomplicated: Secondary | ICD-10-CM | POA: Diagnosis not present

## 2019-12-04 DIAGNOSIS — R519 Headache, unspecified: Secondary | ICD-10-CM | POA: Diagnosis not present

## 2019-12-04 DIAGNOSIS — Z5321 Procedure and treatment not carried out due to patient leaving prior to being seen by health care provider: Secondary | ICD-10-CM | POA: Diagnosis not present

## 2019-12-04 HISTORY — DX: Unspecified intracranial injury with loss of consciousness status unknown, initial encounter: S06.9XAA

## 2019-12-04 HISTORY — DX: Unspecified intracranial injury with loss of consciousness of unspecified duration, initial encounter: S06.9X9A

## 2019-12-04 MED ORDER — AMOXICILLIN 500 MG PO CAPS: 500.0000 mg | ORAL_CAPSULE | Freq: Three times a day (TID) | ORAL | 0 refills | Status: DC

## 2019-12-04 NOTE — ED Triage Notes (Signed)
Pt c/o headache since about lunch. Pt took ibuprofen about 15 mins prior to arrival.
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
## 2019-12-04 NOTE — Discharge Instructions (Signed)
Your CT scan shows that your skull fracture has healed and your brain looks okay.  You may have some component of sinusitis so take antibiotics as prescribed.  Use Tylenol or ibuprofen at home as needed for headaches.  Follow-up with your doctor.  Return to the ED with sudden onset headache, fever, vomiting, unilateral weakness, numbness, tingling, or other concerns.

## 2019-12-08 ENCOUNTER — Encounter: Payer: Medicaid Other | Attending: Physical Medicine and Rehabilitation | Admitting: Psychology

## 2019-12-08 ENCOUNTER — Other Ambulatory Visit: Payer: Self-pay

## 2019-12-08 DIAGNOSIS — S069X3S Unspecified intracranial injury with loss of consciousness of 1 hour to 5 hours 59 minutes, sequela: Secondary | ICD-10-CM | POA: Insufficient documentation

## 2019-12-08 DIAGNOSIS — Z5181 Encounter for therapeutic drug level monitoring: Secondary | ICD-10-CM | POA: Insufficient documentation

## 2019-12-08 DIAGNOSIS — G3189 Other specified degenerative diseases of nervous system: Secondary | ICD-10-CM | POA: Diagnosis not present

## 2019-12-08 DIAGNOSIS — S069X0S Unspecified intracranial injury without loss of consciousness, sequela: Secondary | ICD-10-CM | POA: Diagnosis not present

## 2019-12-08 DIAGNOSIS — S069X9S Unspecified intracranial injury with loss of consciousness of unspecified duration, sequela: Secondary | ICD-10-CM | POA: Diagnosis not present

## 2019-12-08 DIAGNOSIS — F09 Unspecified mental disorder due to known physiological condition: Secondary | ICD-10-CM | POA: Insufficient documentation

## 2019-12-08 DIAGNOSIS — G44329 Chronic post-traumatic headache, not intractable: Secondary | ICD-10-CM | POA: Insufficient documentation

## 2019-12-08 DIAGNOSIS — Z79891 Long term (current) use of opiate analgesic: Secondary | ICD-10-CM | POA: Insufficient documentation

## 2019-12-08 DIAGNOSIS — R4689 Other symptoms and signs involving appearance and behavior: Secondary | ICD-10-CM | POA: Diagnosis not present

## 2019-12-11 ENCOUNTER — Encounter: Payer: Self-pay | Admitting: Psychology
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
## 2019-12-18 ENCOUNTER — Encounter: Payer: Medicaid Other | Attending: Physical Medicine and Rehabilitation | Admitting: Psychology

## 2019-12-18 ENCOUNTER — Other Ambulatory Visit: Payer: Self-pay

## 2019-12-18 ENCOUNTER — Telehealth: Payer: Self-pay | Admitting: Psychology

## 2019-12-18 DIAGNOSIS — S069X9S Unspecified intracranial injury with loss of consciousness of unspecified duration, sequela: Secondary | ICD-10-CM | POA: Insufficient documentation

## 2019-12-18 DIAGNOSIS — G3189 Other specified degenerative diseases of nervous system: Secondary | ICD-10-CM | POA: Diagnosis present

## 2019-12-18 DIAGNOSIS — R4689 Other symptoms and signs involving appearance and behavior: Secondary | ICD-10-CM | POA: Diagnosis not present

## 2019-12-18 DIAGNOSIS — F09 Unspecified mental disorder due to known physiological condition: Secondary | ICD-10-CM | POA: Diagnosis present

## 2019-12-18 DIAGNOSIS — S069X0S Unspecified intracranial injury without loss of consciousness, sequela: Secondary | ICD-10-CM | POA: Diagnosis not present

## 2019-12-18 DIAGNOSIS — Z79891 Long term (current) use of opiate analgesic: Secondary | ICD-10-CM | POA: Diagnosis present

## 2019-12-18 DIAGNOSIS — Z5181 Encounter for therapeutic drug level monitoring: Secondary | ICD-10-CM | POA: Diagnosis present

## 2019-12-18 DIAGNOSIS — S069X3S Unspecified intracranial injury with loss of consciousness of 1 hour to 5 hours 59 minutes, sequela: Secondary | ICD-10-CM | POA: Insufficient documentation

## 2019-12-18 DIAGNOSIS — G44329 Chronic post-traumatic headache, not intractable: Secondary | ICD-10-CM | POA: Insufficient documentation

## 2019-12-18 NOTE — Telephone Encounter (Signed)
The advocate for patient's SSI is Carollee Herter [phone] ext [phone].

## 2019-12-23 ENCOUNTER — Other Ambulatory Visit: Payer: Self-pay

## 2019-12-23 ENCOUNTER — Encounter (HOSPITAL_BASED_OUTPATIENT_CLINIC_OR_DEPARTMENT_OTHER): Payer: Medicaid Other | Admitting: Psychology

## 2019-12-23 DIAGNOSIS — R4689 Other symptoms and signs involving appearance and behavior: Secondary | ICD-10-CM | POA: Diagnosis not present

## 2019-12-23 DIAGNOSIS — S069X0S Unspecified intracranial injury without loss of consciousness, sequela: Secondary | ICD-10-CM

## 2019-12-23 DIAGNOSIS — G3189 Other specified degenerative diseases of nervous system: Secondary | ICD-10-CM | POA: Diagnosis not present

## 2019-12-24 ENCOUNTER — Encounter: Payer: Medicaid Other | Admitting: Psychology

## 2019-12-24 DIAGNOSIS — R4689 Other symptoms and signs involving appearance and behavior: Secondary | ICD-10-CM

## 2019-12-24 DIAGNOSIS — S069X0S Unspecified intracranial injury without loss of consciousness, sequela: Secondary | ICD-10-CM

## 2019-12-24 DIAGNOSIS — G3189 Other specified degenerative diseases of nervous system: Secondary | ICD-10-CM | POA: Diagnosis not present

## 2019-12-29 ENCOUNTER — Encounter: Payer: Self-pay | Admitting: Psychology
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
## 2019-12-30 ENCOUNTER — Encounter: Payer: Self-pay | Admitting: Psychology

## 2019-12-30 NOTE — Progress Notes (Signed)
   Neuropsychology Note  Billy Malone completed 120 minutes of neuropsychological testing with this provider. The patient did not appear overtly distressed by the testing session, per behavioral observation or via self-report. Rest breaks were offered.   Billy Malone will return tomorrow to complete another 120 minutes of neuropsychological testing with this provider.   Full report to follow.

## 2019-12-30 NOTE — Progress Notes (Signed)
   Neuropsychology Note  Billy Malone completed another 120 minutes of neuropsychological testing with this provider. The patient did not appear overtly distressed by the testing session, per behavioral observation or via self-report. Rest breaks were offered.    Billy Malone and his mother will return on 01/01/20 at 9:00AM to complete [place] Adaptive Behavior Scales (3rd Edition) Parent Rating Form.   Full report to follow.

## 2020-01-01 ENCOUNTER — Other Ambulatory Visit: Payer: Self-pay

## 2020-01-01 ENCOUNTER — Encounter: Payer: Medicaid Other | Admitting: Psychology

## 2020-01-01 DIAGNOSIS — S069X0S Unspecified intracranial injury without loss of consciousness, sequela: Secondary | ICD-10-CM

## 2020-01-01 DIAGNOSIS — R4689 Other symptoms and signs involving appearance and behavior: Secondary | ICD-10-CM | POA: Diagnosis not present

## 2020-01-01 DIAGNOSIS — G3189 Other specified degenerative diseases of nervous system: Secondary | ICD-10-CM | POA: Diagnosis not present

## 2020-01-05 ENCOUNTER — Encounter: Payer: Self-pay | Admitting: Psychology

## 2020-01-05 NOTE — Progress Notes (Signed)
   Neuropsychology Note  Billy Malone was on time for his testing appointment and was accompanied by his mother. His mother completed the [place] Adaptive Behavior Scales (3rd Edition) Parent Rating Form on the computer in the testing room while I administered the Judgment subtest from the NAB to patient. The testing appointment lasted 60 minutes.   Billy Malone and his mother will return on 01/08/20 at 10:00AM for a feedback session at which time results of testing, clinical impressions, and recommendations will be reviewed in detail.   Full report to follow.

## 2020-01-06 ENCOUNTER — Encounter (HOSPITAL_BASED_OUTPATIENT_CLINIC_OR_DEPARTMENT_OTHER): Payer: Medicaid Other | Admitting: Psychology

## 2020-01-06 ENCOUNTER — Other Ambulatory Visit: Payer: Self-pay

## 2020-01-06 DIAGNOSIS — S069X9S Unspecified intracranial injury with loss of consciousness of unspecified duration, sequela: Secondary | ICD-10-CM | POA: Diagnosis not present

## 2020-01-06 DIAGNOSIS — F0281 Dementia in other diseases classified elsewhere with behavioral disturbance: Secondary | ICD-10-CM

## 2020-01-06 DIAGNOSIS — G3189 Other specified degenerative diseases of nervous system: Secondary | ICD-10-CM | POA: Diagnosis not present

## 2020-01-06 DIAGNOSIS — IMO0001 Reserved for inherently not codable concepts without codable children: Secondary | ICD-10-CM

## 2020-01-08 ENCOUNTER — Encounter: Payer: Medicaid Other | Admitting: Psychology

## 2020-01-08 ENCOUNTER — Other Ambulatory Visit: Payer: Self-pay

## 2020-01-08 DIAGNOSIS — S069X9S Unspecified intracranial injury with loss of consciousness of unspecified duration, sequela: Secondary | ICD-10-CM

## 2020-01-08 DIAGNOSIS — F0281 Dementia in other diseases classified elsewhere with behavioral disturbance: Secondary | ICD-10-CM | POA: Diagnosis not present

## 2020-01-08 DIAGNOSIS — G3189 Other specified degenerative diseases of nervous system: Secondary | ICD-10-CM | POA: Diagnosis not present

## 2020-01-08 DIAGNOSIS — IMO0001 Reserved for inherently not codable concepts without codable children: Secondary | ICD-10-CM

## 2020-01-09 ENCOUNTER — Encounter: Payer: Self-pay | Admitting: Psychology
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
## 2020-01-16 ENCOUNTER — Other Ambulatory Visit: Payer: Self-pay

## 2020-01-16 ENCOUNTER — Telehealth: Payer: Self-pay | Admitting: Psychology

## 2020-01-16 ENCOUNTER — Encounter: Payer: Medicaid Other | Attending: Physical Medicine and Rehabilitation | Admitting: Psychology

## 2020-01-16 DIAGNOSIS — R4689 Other symptoms and signs involving appearance and behavior: Secondary | ICD-10-CM | POA: Insufficient documentation

## 2020-01-16 DIAGNOSIS — F09 Unspecified mental disorder due to known physiological condition: Secondary | ICD-10-CM | POA: Insufficient documentation

## 2020-01-16 DIAGNOSIS — S069X9S Unspecified intracranial injury with loss of consciousness of unspecified duration, sequela: Secondary | ICD-10-CM | POA: Diagnosis present

## 2020-01-16 DIAGNOSIS — S069X3S Unspecified intracranial injury with loss of consciousness of 1 hour to 5 hours 59 minutes, sequela: Secondary | ICD-10-CM | POA: Insufficient documentation

## 2020-01-16 DIAGNOSIS — Z79891 Long term (current) use of opiate analgesic: Secondary | ICD-10-CM | POA: Insufficient documentation

## 2020-01-16 DIAGNOSIS — S069X0S Unspecified intracranial injury without loss of consciousness, sequela: Secondary | ICD-10-CM | POA: Insufficient documentation

## 2020-01-16 DIAGNOSIS — Z5181 Encounter for therapeutic drug level monitoring: Secondary | ICD-10-CM | POA: Diagnosis present

## 2020-01-16 DIAGNOSIS — G3189 Other specified degenerative diseases of nervous system: Secondary | ICD-10-CM | POA: Diagnosis present

## 2020-01-16 DIAGNOSIS — G44329 Chronic post-traumatic headache, not intractable: Secondary | ICD-10-CM | POA: Insufficient documentation

## 2020-01-20 ENCOUNTER — Encounter: Payer: Medicaid Other | Admitting: Psychology

## 2020-01-20 ENCOUNTER — Other Ambulatory Visit: Payer: Self-pay

## 2020-01-20 ENCOUNTER — Telehealth: Payer: Self-pay

## 2020-01-20 DIAGNOSIS — R4689 Other symptoms and signs involving appearance and behavior: Secondary | ICD-10-CM | POA: Diagnosis not present

## 2020-01-20 DIAGNOSIS — S069X0S Unspecified intracranial injury without loss of consciousness, sequela: Secondary | ICD-10-CM | POA: Diagnosis not present

## 2020-01-20 NOTE — Telephone Encounter (Signed)
Please call  Billy Malone with the [facility] Department. Call back ph [phone] Ext [phone] or [phone]. He has a few question about a case he is working on  [employer]).

## 2020-01-29 ENCOUNTER — Encounter: Payer: Self-pay | Admitting: Psychology

## 2020-01-29 ENCOUNTER — Encounter: Payer: Medicaid Other | Admitting: Psychology
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
## 2020-01-30 ENCOUNTER — Encounter: Payer: Self-pay | Admitting: Psychology
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
## 2020-02-03 ENCOUNTER — Other Ambulatory Visit: Payer: Self-pay

## 2020-02-03 ENCOUNTER — Encounter: Payer: Self-pay | Admitting: Psychology

## 2020-02-03 ENCOUNTER — Encounter: Payer: Medicaid Other | Admitting: Psychology

## 2020-02-03 DIAGNOSIS — R4689 Other symptoms and signs involving appearance and behavior: Secondary | ICD-10-CM | POA: Diagnosis not present

## 2020-02-03 DIAGNOSIS — S069X0S Unspecified intracranial injury without loss of consciousness, sequela: Secondary | ICD-10-CM | POA: Diagnosis not present
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
## 2020-02-04 ENCOUNTER — Encounter: Payer: Self-pay | Admitting: Physical Medicine & Rehabilitation

## 2020-02-04 ENCOUNTER — Ambulatory Visit: Payer: Medicaid Other | Admitting: Physical Medicine & Rehabilitation

## 2020-02-04 ENCOUNTER — Encounter: Payer: Medicaid Other | Admitting: Physical Medicine & Rehabilitation

## 2020-02-04 VITALS — BP 110/69 | HR 76 | Temp 98.2°F | Ht 72.0 in | Wt 176.0 lb

## 2020-02-04 DIAGNOSIS — G44329 Chronic post-traumatic headache, not intractable: Secondary | ICD-10-CM

## 2020-02-04 DIAGNOSIS — S069X0S Unspecified intracranial injury without loss of consciousness, sequela: Secondary | ICD-10-CM | POA: Diagnosis not present

## 2020-02-04 DIAGNOSIS — R4689 Other symptoms and signs involving appearance and behavior: Secondary | ICD-10-CM | POA: Diagnosis not present

## 2020-02-04 DIAGNOSIS — G44309 Post-traumatic headache, unspecified, not intractable: Secondary | ICD-10-CM | POA: Insufficient documentation

## 2020-02-04 MED ORDER — DIVALPROEX SODIUM ER 500 MG PO TB24: 1000.0000 mg | ORAL_TABLET | Freq: Every day | ORAL | 4 refills | Status: DC
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
## 2020-02-04 NOTE — Patient Instructions (Signed)
WORK ON USING A DAILY OR CALENDAR.   WORK ON ONE THING AT A TIME UNTIL YOU COMPLETE IT. THEN MARK IT OFF AS DONE.

## 2020-02-05 ENCOUNTER — Telehealth: Payer: Self-pay

## 2020-02-05 NOTE — Telephone Encounter (Signed)
Delma's Mother called back:   The  2 antibiotics he will need sent to the pharmacy are:  Doxycycline 100 MG & Clindamycin Gel 45g.   Please send to pharmacy in the chart. Thank you.

## 2020-02-06 NOTE — Telephone Encounter (Signed)
I told mom I would help with those meds if she needed rx's for them. If she wants me to write them, I need sigs. I will not be able to get to these until Monday once she sends rx details.

## 2020-02-08 DIAGNOSIS — S99921A Unspecified injury of right foot, initial encounter: Secondary | ICD-10-CM | POA: Diagnosis not present

## 2020-02-08 DIAGNOSIS — S93601A Unspecified sprain of right foot, initial encounter: Secondary | ICD-10-CM | POA: Diagnosis not present

## 2020-02-08 DIAGNOSIS — X501XXA Overexertion from prolonged static or awkward postures, initial encounter: Secondary | ICD-10-CM | POA: Diagnosis not present

## 2020-02-10 ENCOUNTER — Other Ambulatory Visit: Payer: Self-pay

## 2020-02-10 ENCOUNTER — Encounter: Payer: Medicaid Other | Admitting: Psychology

## 2020-02-10 DIAGNOSIS — R4689 Other symptoms and signs involving appearance and behavior: Secondary | ICD-10-CM | POA: Diagnosis not present

## 2020-02-10 DIAGNOSIS — S069X0S Unspecified intracranial injury without loss of consciousness, sequela: Secondary | ICD-10-CM | POA: Diagnosis not present

## 2020-02-17 ENCOUNTER — Encounter: Payer: Medicaid Other | Attending: Physical Medicine and Rehabilitation | Admitting: Psychology

## 2020-02-17 ENCOUNTER — Other Ambulatory Visit: Payer: Self-pay

## 2020-02-17 DIAGNOSIS — G3189 Other specified degenerative diseases of nervous system: Secondary | ICD-10-CM | POA: Insufficient documentation

## 2020-02-17 DIAGNOSIS — F09 Unspecified mental disorder due to known physiological condition: Secondary | ICD-10-CM | POA: Diagnosis present

## 2020-02-17 DIAGNOSIS — Z79891 Long term (current) use of opiate analgesic: Secondary | ICD-10-CM | POA: Insufficient documentation

## 2020-02-17 DIAGNOSIS — G44329 Chronic post-traumatic headache, not intractable: Secondary | ICD-10-CM | POA: Insufficient documentation

## 2020-02-17 DIAGNOSIS — S069X3S Unspecified intracranial injury with loss of consciousness of 1 hour to 5 hours 59 minutes, sequela: Secondary | ICD-10-CM | POA: Diagnosis present

## 2020-02-17 DIAGNOSIS — S069X9S Unspecified intracranial injury with loss of consciousness of unspecified duration, sequela: Secondary | ICD-10-CM | POA: Insufficient documentation

## 2020-02-17 DIAGNOSIS — R4689 Other symptoms and signs involving appearance and behavior: Secondary | ICD-10-CM | POA: Insufficient documentation

## 2020-02-17 DIAGNOSIS — S069X0S Unspecified intracranial injury without loss of consciousness, sequela: Secondary | ICD-10-CM | POA: Diagnosis not present

## 2020-02-17 DIAGNOSIS — Z5181 Encounter for therapeutic drug level monitoring: Secondary | ICD-10-CM | POA: Insufficient documentation

## 2020-02-18 ENCOUNTER — Encounter: Payer: Self-pay | Admitting: Psychology
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
## 2020-02-24 ENCOUNTER — Other Ambulatory Visit: Payer: Self-pay

## 2020-02-24 ENCOUNTER — Encounter: Payer: Medicaid Other | Admitting: Psychology

## 2020-02-24 ENCOUNTER — Encounter: Payer: Self-pay | Admitting: Psychology

## 2020-02-24 DIAGNOSIS — G44329 Chronic post-traumatic headache, not intractable: Secondary | ICD-10-CM

## 2020-02-24 DIAGNOSIS — R4689 Other symptoms and signs involving appearance and behavior: Secondary | ICD-10-CM | POA: Diagnosis not present

## 2020-02-24 DIAGNOSIS — IMO0001 Reserved for inherently not codable concepts without codable children: Secondary | ICD-10-CM

## 2020-02-24 DIAGNOSIS — S069X0S Unspecified intracranial injury without loss of consciousness, sequela: Secondary | ICD-10-CM | POA: Diagnosis not present

## 2020-02-24 DIAGNOSIS — F0281 Dementia in other diseases classified elsewhere with behavioral disturbance: Secondary | ICD-10-CM | POA: Diagnosis not present

## 2020-02-24 DIAGNOSIS — S069X9S Unspecified intracranial injury with loss of consciousness of unspecified duration, sequela: Secondary | ICD-10-CM | POA: Diagnosis not present

## 2020-02-25 ENCOUNTER — Ambulatory Visit: Payer: Medicaid Other | Admitting: Physical Medicine & Rehabilitation
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
## 2020-03-02 ENCOUNTER — Encounter: Payer: Self-pay | Admitting: Psychology

## 2020-03-02 ENCOUNTER — Other Ambulatory Visit: Payer: Self-pay

## 2020-03-02 ENCOUNTER — Encounter: Payer: Medicaid Other | Admitting: Psychology

## 2020-03-02 DIAGNOSIS — R4689 Other symptoms and signs involving appearance and behavior: Secondary | ICD-10-CM

## 2020-03-02 DIAGNOSIS — S069X0S Unspecified intracranial injury without loss of consciousness, sequela: Secondary | ICD-10-CM

## 2020-03-02 NOTE — Progress Notes (Addendum)
We continued to work on therapeutic interventions for coping with the residual cognitive deficits associated with his TBI and inability to work.  The patient continues to describe many stressors that negatively impact his mood and behavior. He has remained substance free except for tobacco since 01/07/20. He has not heard back from his SSID advocate, which causes him to worry about getting denied and feel hopeless.   His needs remain the same. He is slowly engaging in more activity during the week but continues to struggle with apathy and low motivation. He is highly self-critical and spends much time ruminating about the past and worrying about uncertain future. He is learning basic distress tolerance skills and emotion regulation in addition to mindfulness. He continues to progress in reaching his goals of staying clean from [facility] and ETOH; cigarette use has not decreased. He has reportedly been limiting phone use at night which seems to help somewhat. Sleep hygiene will continually be addressed and his quality monitored.

## 2020-03-04 ENCOUNTER — Encounter: Payer: Self-pay | Admitting: Psychology
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
## 2020-03-09 ENCOUNTER — Ambulatory Visit: Payer: Medicaid Other | Admitting: Psychology

## 2020-03-23 ENCOUNTER — Encounter: Payer: Medicaid Other | Attending: Physical Medicine and Rehabilitation | Admitting: Psychology

## 2020-03-23 ENCOUNTER — Encounter: Payer: Self-pay | Admitting: Psychology

## 2020-03-23 ENCOUNTER — Other Ambulatory Visit: Payer: Self-pay

## 2020-03-23 DIAGNOSIS — F17200 Nicotine dependence, unspecified, uncomplicated: Secondary | ICD-10-CM | POA: Diagnosis present

## 2020-03-23 DIAGNOSIS — R4689 Other symptoms and signs involving appearance and behavior: Secondary | ICD-10-CM | POA: Diagnosis not present

## 2020-03-23 DIAGNOSIS — S069X0S Unspecified intracranial injury without loss of consciousness, sequela: Secondary | ICD-10-CM | POA: Diagnosis not present
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
## 2020-03-29 ENCOUNTER — Ambulatory Visit: Payer: Medicaid Other | Admitting: Psychology

## 2020-04-01 ENCOUNTER — Other Ambulatory Visit: Payer: Self-pay

## 2020-04-01 ENCOUNTER — Encounter (HOSPITAL_BASED_OUTPATIENT_CLINIC_OR_DEPARTMENT_OTHER): Payer: Medicaid Other | Admitting: Psychology

## 2020-04-01 DIAGNOSIS — R4689 Other symptoms and signs involving appearance and behavior: Secondary | ICD-10-CM | POA: Diagnosis not present

## 2020-04-01 DIAGNOSIS — S069X0S Unspecified intracranial injury without loss of consciousness, sequela: Secondary | ICD-10-CM

## 2020-04-05 ENCOUNTER — Encounter: Payer: Self-pay | Admitting: Psychology
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
## 2020-04-06 ENCOUNTER — Other Ambulatory Visit: Payer: Self-pay

## 2020-04-06 ENCOUNTER — Encounter: Payer: Medicaid Other | Admitting: Psychology

## 2020-04-06 DIAGNOSIS — R4689 Other symptoms and signs involving appearance and behavior: Secondary | ICD-10-CM

## 2020-04-06 DIAGNOSIS — F17200 Nicotine dependence, unspecified, uncomplicated: Secondary | ICD-10-CM | POA: Diagnosis not present

## 2020-04-06 DIAGNOSIS — S069X0S Unspecified intracranial injury without loss of consciousness, sequela: Secondary | ICD-10-CM | POA: Diagnosis not present

## 2020-04-09 ENCOUNTER — Encounter: Payer: Self-pay | Admitting: Psychology
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
## 2020-04-21 ENCOUNTER — Encounter: Payer: Medicaid Other | Admitting: Psychology

## 2020-04-29 ENCOUNTER — Encounter: Payer: Medicaid Other | Attending: Physical Medicine and Rehabilitation | Admitting: Psychology

## 2020-04-29 ENCOUNTER — Encounter: Payer: Self-pay | Admitting: Psychology

## 2020-04-29 ENCOUNTER — Other Ambulatory Visit: Payer: Self-pay

## 2020-04-29 DIAGNOSIS — R4689 Other symptoms and signs involving appearance and behavior: Secondary | ICD-10-CM | POA: Diagnosis not present

## 2020-04-29 DIAGNOSIS — S069X0S Unspecified intracranial injury without loss of consciousness, sequela: Secondary | ICD-10-CM | POA: Diagnosis not present

## 2020-04-29 DIAGNOSIS — F17200 Nicotine dependence, unspecified, uncomplicated: Secondary | ICD-10-CM | POA: Diagnosis not present

## 2020-04-29 DIAGNOSIS — G44329 Chronic post-traumatic headache, not intractable: Secondary | ICD-10-CM | POA: Diagnosis not present
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
## 2020-05-11 ENCOUNTER — Other Ambulatory Visit: Payer: Self-pay

## 2020-05-11 ENCOUNTER — Encounter (HOSPITAL_BASED_OUTPATIENT_CLINIC_OR_DEPARTMENT_OTHER): Payer: Medicaid Other | Admitting: Psychology

## 2020-05-11 DIAGNOSIS — S069X0S Unspecified intracranial injury without loss of consciousness, sequela: Secondary | ICD-10-CM | POA: Diagnosis not present

## 2020-05-11 DIAGNOSIS — F17200 Nicotine dependence, unspecified, uncomplicated: Secondary | ICD-10-CM | POA: Diagnosis not present

## 2020-05-11 DIAGNOSIS — G44329 Chronic post-traumatic headache, not intractable: Secondary | ICD-10-CM | POA: Diagnosis not present

## 2020-05-11 DIAGNOSIS — R4689 Other symptoms and signs involving appearance and behavior: Secondary | ICD-10-CM | POA: Diagnosis not present

## 2020-05-12 ENCOUNTER — Encounter: Payer: Self-pay | Admitting: Psychology

## 2020-05-12 NOTE — Progress Notes (Addendum)
Neuropsychology Visit Therapy Progress Note  Billy Malone  MRN:  [id number]  DOS: 05/11/20 Subjective:  "I had to go into another room during my son's birthday because I felt uncomfortable around a lot of people. I spent most of the party by myself listening to music"   Principal Problem:  Apathy and withdrawl, poor communication, ineffective coping skills, and cognitive, emotional, and behavioral changes after TBI,    Diagnosis: Difficulty controlling behavior as late effect of traumatic brain injury (HCC) Total Time spent with patient: 1 hour  Past Medical History:  Past Medical History:  Diagnosis Date  . TBI (traumatic brain injury) [facility])     Past Surgical History:  Procedure Laterality Date  . APPENDECTOMY    . TYMPANOSTOMY TUBE PLACEMENT    . WISDOM TOOTH EXTRACTION     Family History:  Family History  Problem Relation Age of Onset  . Hypertension Maternal Grandmother   . Diabetes Maternal Grandmother   . Hypertension Maternal Grandfather   . Diabetes Maternal Grandfather    Social History:  Social History   Substance and Sexual Activity  Alcohol Use Not Currently     Social History   Substance and Sexual Activity  Drug Use Yes  . Types: Marijuana, Benzodiazepines    Social History   Socioeconomic History  . Marital status: Single    Spouse name: Not on file  . Number of children: Not on file  . Years of education: Not on file  . Highest education level: Not on file  Occupational History  . Not on file  Tobacco Use  . Smoking status: Current Some Day Smoker    Packs/day: 1.00  . Smokeless tobacco: Never Used  Vaping Use  . Vaping Use: Unknown  Substance and Sexual Activity  . Alcohol use: Not Currently  . Drug use: Yes    Types: Marijuana, Benzodiazepines  . Sexual activity: Yes  Other Topics Concern  . Not on file  Social History Narrative   ** Merged History Encounter **       Social Determinants of Health   Financial Resource  Strain:   . Difficulty of Paying Living Expenses: Not on file  Food Insecurity:   . Worried About Programme researcher, broadcasting/film/video in the Last Year: Not on file  . Ran Out of Food in the Last Year: Not on file  Transportation Needs:   . Lack of Transportation (Medical): Not on file  . Lack of Transportation (Non-Medical): Not on file  Physical Activity:   . Days of Exercise per Week: Not on file  . Minutes of Exercise per Session: Not on file  Stress:   . Feeling of Stress : Not on file  Social Connections:   . Frequency of Communication with Friends and Family: Not on file  . Frequency of Social Gatherings with Friends and Family: Not on file  . Attends Religious Services: Not on file  . Active Member of Clubs or Organizations: Not on file  . Attends Banker Meetings: Not on file  . Marital Status: Not on file   Sleep: Fair Appetite:  Fair  Current Medications: Current Outpatient Medications  Medication Sig Dispense Refill  . divalproex (DEPAKOTE ER) 500 MG 24 hr tablet Take 2 tablets (1,000 mg total) by mouth daily. 60 tablet 4   No current facility-administered medications for this visit.   Lab Results: No results found for this or any previous visit (from the past 48 hour(s)).  Musculoskeletal: Strength &  Muscle Tone: within normal limits Gait & Station: normal  Psychiatric Specialty Exam: Physical Exam  Review of Systems  There were no vitals taken for this visit.There is no height or weight on file to calculate BMI.  General Appearance: Casual, Fairly Groomed and Guarded  Eye Contact:  Minimal  Speech:  reduced rate  Volume:  Normal  Mood:  Depressed, Dysphoric, Hopeless and Worthless  Affect:  Blunt and Depressed  Thought Process:  Disorganized and concrete  Orientation:  Full (Time, Place, and Person)  Thought Content:  Illogical, Paranoid Ideation, Rumination and Tangential  Suicidal Thoughts:  No  Homicidal Thoughts:  No  Memory:  Immediate;    Poor Recent;   Poor Remote;   Fair  Judgement:  Fair  Insight:  Shallow  Psychomotor Activity:  Decreased  Concentration:  Concentration: Poor and Attention Span: Poor  Recall:  [employer] of Knowledge:  Poor  Language:  Poor  Akathisia:  No  Handed:  Right  AIMS (if indicated):     Assets:  Others:  Family involvement   ADL's:  Impaired  Cognition:  Impaired,  Moderate  Sleep:  Fair    Intervention: Motivational Interviewing, problem solving, behavioral activation   Response/Effectiveness: Fair, minimal progress but expected to made gradual strides towards reaching treatment goals. Excellent progress with regards to substance use as he remained sober for last 5-6 months.   Plan: Continue weekly individual psychotherapy (1x60 min) to address treatment plan goals.  Next IND psychotherapy session scheduled for 05/20/20    Horton Finer

## 2020-05-20 ENCOUNTER — Other Ambulatory Visit: Payer: Self-pay

## 2020-05-20 ENCOUNTER — Encounter: Payer: Medicaid Other | Attending: Physical Medicine and Rehabilitation | Admitting: Psychology

## 2020-05-20 DIAGNOSIS — R4689 Other symptoms and signs involving appearance and behavior: Secondary | ICD-10-CM

## 2020-05-20 DIAGNOSIS — F068 Other specified mental disorders due to known physiological condition: Secondary | ICD-10-CM | POA: Diagnosis present

## 2020-05-20 DIAGNOSIS — S069X0S Unspecified intracranial injury without loss of consciousness, sequela: Secondary | ICD-10-CM | POA: Diagnosis not present

## 2020-05-20 DIAGNOSIS — G44329 Chronic post-traumatic headache, not intractable: Secondary | ICD-10-CM | POA: Diagnosis not present

## 2020-05-20 DIAGNOSIS — E039 Hypothyroidism, unspecified: Secondary | ICD-10-CM | POA: Diagnosis present

## 2020-05-20 DIAGNOSIS — R7989 Other specified abnormal findings of blood chemistry: Secondary | ICD-10-CM | POA: Diagnosis present

## 2020-05-20 DIAGNOSIS — S069X9D Unspecified intracranial injury with loss of consciousness of unspecified duration, subsequent encounter: Secondary | ICD-10-CM | POA: Insufficient documentation

## 2020-05-20 DIAGNOSIS — F0281 Dementia in other diseases classified elsewhere with behavioral disturbance: Secondary | ICD-10-CM | POA: Insufficient documentation

## 2020-05-20 DIAGNOSIS — F17200 Nicotine dependence, unspecified, uncomplicated: Secondary | ICD-10-CM

## 2020-05-27 ENCOUNTER — Encounter: Payer: Medicaid Other | Admitting: Psychology

## 2020-05-28 ENCOUNTER — Encounter: Payer: Self-pay | Admitting: Psychology
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
## 2020-06-01 ENCOUNTER — Encounter: Payer: Medicaid Other | Admitting: Psychology

## 2020-06-01 ENCOUNTER — Encounter: Payer: Self-pay | Admitting: Psychology

## 2020-06-01 ENCOUNTER — Other Ambulatory Visit: Payer: Self-pay

## 2020-06-01 DIAGNOSIS — F17200 Nicotine dependence, unspecified, uncomplicated: Secondary | ICD-10-CM | POA: Diagnosis not present

## 2020-06-01 DIAGNOSIS — S069X9D Unspecified intracranial injury with loss of consciousness of unspecified duration, subsequent encounter: Secondary | ICD-10-CM

## 2020-06-01 DIAGNOSIS — IMO0001 Reserved for inherently not codable concepts without codable children: Secondary | ICD-10-CM

## 2020-06-01 DIAGNOSIS — R4689 Other symptoms and signs involving appearance and behavior: Secondary | ICD-10-CM

## 2020-06-01 DIAGNOSIS — G44329 Chronic post-traumatic headache, not intractable: Secondary | ICD-10-CM | POA: Diagnosis not present

## 2020-06-01 DIAGNOSIS — F0281 Dementia in other diseases classified elsewhere with behavioral disturbance: Secondary | ICD-10-CM

## 2020-06-01 DIAGNOSIS — S069X0S Unspecified intracranial injury without loss of consciousness, sequela: Secondary | ICD-10-CM

## 2020-06-02 ENCOUNTER — Encounter (HOSPITAL_BASED_OUTPATIENT_CLINIC_OR_DEPARTMENT_OTHER): Payer: Medicaid Other | Admitting: Physical Medicine & Rehabilitation

## 2020-06-02 ENCOUNTER — Encounter: Payer: Self-pay | Admitting: Physical Medicine & Rehabilitation

## 2020-06-02 VITALS — BP 130/82 | HR 62 | Temp 97.5°F | Ht 72.0 in | Wt 192.2 lb

## 2020-06-02 DIAGNOSIS — S069X0S Unspecified intracranial injury without loss of consciousness, sequela: Secondary | ICD-10-CM

## 2020-06-02 DIAGNOSIS — F068 Other specified mental disorders due to known physiological condition: Secondary | ICD-10-CM

## 2020-06-02 DIAGNOSIS — E039 Hypothyroidism, unspecified: Secondary | ICD-10-CM | POA: Diagnosis not present

## 2020-06-02 DIAGNOSIS — R4689 Other symptoms and signs involving appearance and behavior: Secondary | ICD-10-CM

## 2020-06-02 DIAGNOSIS — R7989 Other specified abnormal findings of blood chemistry: Secondary | ICD-10-CM | POA: Diagnosis not present

## 2020-06-02 NOTE — Progress Notes (Signed)
Subjective:    Patient ID: Billy Malone, male    DOB: [birth year], 24 y.o.   MRN: [id number]  HPI   Billy Malone is here in follow up of his TBI. He is still not sleeping. He is on his phone playing games until 0200 in the morning. He states that he doesn't have any interests at home.  He is home most the day.  He does play with his children at times.  He will occasionally ride his bike and do some push-ups.  He is really not on any routine of sorts at this point.  I asked mom if she pushes him to do more at home and she states that she is working so much that she is not around to do so.  He states that he does not like being around people.  I have asked him if he is depressed and he denies it.  He and Dr. Vella Kohler are working on compensatory strategies and relaxation techniques.  He does at those helped somewhat.  He has talk with him about vocational rehab and they have a packet at home he received although they have not initiated that process yet.  I asked Billy Malone what he might be interested in doing in the future he really cannot come up with a whole lot to tell me.   Pain Inventory Average Pain 0 Pain Right Now 0 My pain is intermittent and aching  LOCATION OF PAIN  Both hips & abdomen  BOWEL Number of stools per week:  3-4 Oral laxative use No  Type of laxative   Enema or suppository use No  History of colostomy No  Incontinent No   BLADDER Normal In and out cath, frequency N/A Able to self cath No  Bladder incontinence No  Frequent urination No  Leakage with coughing No  Difficulty starting stream No  Incomplete bladder emptying No    Mobility ability to climb steps?  yes do you drive?  no Do you have any goals in this area?  yes  Function disabled: date disabled in progress I need assistance with the following:  meal prep and shopping Do you have any goals in this area?  yes  Neuro/Psych dizziness confusion loss of taste or smell  Prior Studies Any changes since  last visit?  no  Physicians involved in your care Any changes since last visit?  no   Family History  Problem Relation Age of Onset  . Hypertension Maternal Grandmother   . Diabetes Maternal Grandmother   . Hypertension Maternal Grandfather   . Diabetes Maternal Grandfather    Social History   Socioeconomic History  . Marital status: Single    Spouse name: Not on file  . Number of children: Not on file  . Years of education: Not on file  . Highest education level: Not on file  Occupational History  . Not on file  Tobacco Use  . Smoking status: Current Some Day Smoker    Packs/day: 1.00  . Smokeless tobacco: Never Used  Vaping Use  . Vaping Use: Never used  Substance and Sexual Activity  . Alcohol use: Not Currently  . Drug use: Yes    Types: Marijuana, Benzodiazepines  . Sexual activity: Yes  Other Topics Concern  . Not on file  Social History Narrative   ** Merged History Encounter **       Social Determinants of Health   Financial Resource Strain: Not on file  Food Insecurity: Not on file  Transportation Needs: Not on file  Physical Activity: Not on file  Stress: Not on file  Social Connections: Not on file   Past Surgical History:  Procedure Laterality Date  . APPENDECTOMY    . TYMPANOSTOMY TUBE PLACEMENT    . WISDOM TOOTH EXTRACTION     Past Medical History:  Diagnosis Date  . TBI (traumatic brain injury) (HCC)    BP 130/82   Pulse 62   Temp (!) 97.5 F (36.4 C)   Ht 6' (1.829 m)   Wt 192 lb 3.2 oz (87.2 kg)   SpO2 98%   BMI 26.07 kg/m   Opioid Risk Score:   Fall Risk Score:  `1  Depression screen PHQ 2/9  No flowsheet data found. Review of Systems  Musculoskeletal:       Stomach & hip pain  All other systems reviewed and are negative.      Objective:   Physical Exam General: No acute distress HEENT: EOMI, oral membranes moist Cards: reg rate  Chest: normal effort Abdomen: Soft, NT, ND Skin: dry, intact Extremities: no  edema Neuro:  fair awareness and insight. Concentration and short term memory deificts at times. Apathetic Musculoskeletal: minimal pain today Psych:  very flat today, slow to engage.  Did open up a bit after we chatted for a while.       Assessment & Plan:  1.  Imapired function/ADLs/mobility/cognition secondary to TBI, depressed skull fx              -Gradual cognitive progress but I feel we have had a bit of a plateau            -anosmia              -AGAIN discussed keeping a calendar/organizer consisently             -discussed the importance of improved sleep hygiene, turning off phone, consistent bed time etc.    -Neuroendocrine work-up ordered today including thyroid, testosterone, IGF-I, cortisol levels.  Additionally I ordered a complete metabolic panel and CBC today.  2.  Pain Management:  Headaches improved after resumption of medications           -Depakote 500mg  bid for headache proph     -off proproanolol      4. Mood:              -depakote as above             -continue with dr. , gradually making progress. Encouraged him to go out of his comfort zone a bit. He needs to start taking on some responsibilities at home also   -ordered hormonal panel today    15 minutes of face to face patient care time were spent during this visit. All questions were encouraged and answered. Follow up with me in about 3 months.

## 2020-06-02 NOTE — Patient Instructions (Signed)
SET A STANDARD BEDTIME---TURN OFF TV, PHONE. TRY LISTENING TO SOME RELAXING MUSIC, MAYBE READING AN ARTICLE. GIVE YOURSELF AN ATMOSPHERE TO HELP YOUR SLEEP   CREATE A WEEKLY SCHEDULE (M-F) SO THAT YOU HAVE A ROUTINE.    BE REGULAR TAKING NOTES, KEEPING AN ORGANIZER OR SCHEDULE. YOU SHOULDN'T JUST BE FREE-WHEELING THINGS!   PLEASE FEEL FREE TO CALL OUR OFFICE WITH ANY PROBLEMS OR QUESTIONS [phone]                                @                 @@               @@@                        @@@@                      @@@@@         @@@@@@                  @@@@@@@                [id number]              @@@@@@@@@             @@@@@@@@@@       IIII                  IIII                                                        HAPPY HOLIDAYS!!!!!

## 2020-06-09 NOTE — Progress Notes (Addendum)
  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 He does not perceive auditory or visual hallucinations.        Mood and Affect: Mood is depressed. Mood is not anxious or elated. Affect is blunt and flat. Affect is not labile, angry or tearful.        Speech: He is communicative. Speech is tangential. Speech is not rapid and pressured, delayed or slurred.        Behavior: Behavior is agitated, slowed and withdrawn. Behavior is not aggressive, hyperactive or combative.        Thought Content: Thought content is paranoid. Thought content is not delusional. Thought content does not include homicidal or suicidal ideation. Thought content does not include homicidal or suicidal plan.        Cognition and Memory: Cognition is impaired. Memory is impaired (immediate contextual verbal memory). He exhibits impaired recent memory and impaired remote memory (verbal; visual memory intact).        Judgment: Judgment is inappropriate. Judgment is not impulsive (Apathy).    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 It is felt more time is needed for Interventions to work.   [name] presents for biweekly individual therapy session due to  problems adjusting to cognitive and physical changes from TBI.  He has a history of emotional and behavioral (e.g., oppositional, impulsivity, aggression, etc.)    problems that complicate recovery and rehab process. He continues to present with depressed mood, poor social interaction, apathy and low motivation, attention and verbal memory deficits, agitation, and stubbornness. There are also educational concerns, financial difficulties, occupational concerns, tobacco abuse, and experience of a traumatic event.   Patient continues to report staying away from alcohol, THC, and other illicit substances for the past several months. He continues to smoke around 1 pack of cigarettes per day; purchased by mother. Mother was strongly encouraged  to stop supplying cigarettes given patient's desire to quit. Patient's current partner (and mother of their 94 y.o. son) also smokes cigarettes and reportedly does not plan on stopping anytime soon. This presents an obstacle that will be addressed in therapy.    He is not participating in any pleasurable activities during the week and describes doing "nothing" most days.   Homework: Did not complete during past 2 weeks. Assignment remains the same. Complete application for vocational rehabilitation   Plan:   Plan is to continue biweekly therapy to develop psychosocial skills (e.g., time management, problem solving, social skills training) and coping strategies to manage stress (e.g., progressive relaxation, diaphragmatic breathing, imagery, etc.) and improve mood and behavior. We are currently targeting self-efficacy, self-esteem, and sense of purpose through various cognitive and behavioral techniques.      Return visit on 06/10/20   Horton Finer, PsyD

## 2020-06-10 ENCOUNTER — Encounter: Payer: Medicaid Other | Admitting: Psychology

## 2020-06-10 ENCOUNTER — Other Ambulatory Visit: Payer: Self-pay

## 2020-06-10 DIAGNOSIS — R4689 Other symptoms and signs involving appearance and behavior: Secondary | ICD-10-CM | POA: Diagnosis not present

## 2020-06-10 DIAGNOSIS — F068 Other specified mental disorders due to known physiological condition: Secondary | ICD-10-CM | POA: Diagnosis not present

## 2020-06-10 DIAGNOSIS — G44329 Chronic post-traumatic headache, not intractable: Secondary | ICD-10-CM

## 2020-06-10 DIAGNOSIS — F17200 Nicotine dependence, unspecified, uncomplicated: Secondary | ICD-10-CM | POA: Diagnosis not present

## 2020-06-10 DIAGNOSIS — S069X0S Unspecified intracranial injury without loss of consciousness, sequela: Secondary | ICD-10-CM

## 2020-06-14 ENCOUNTER — Encounter: Payer: Self-pay | Admitting: Psychology

## 2020-06-14 NOTE — Progress Notes (Signed)
Therapy Progress Note   06/10/2020  Torri Langston  MRN:  [id number] Principal Problem:  Cognitive and neurobehavioral deficits after TBI; Tobacco use; Chronic post traumatic headache   Total Time spent with patient: 1 hour  Subjective:    Patient ID: Billy Malone is a 25 y.o. male. DOB: 07/21/1995, 25 y.o.   MRN: [id number]  Chief Complaint:  Described feeling "much happier" now that "I finally moved into my trailer".  Pt.s mother has reportedly stopped supplying son cigarettes, removing one barrier in plan to quit. He now rides bike down to grandmother's place to get money to buy them. He denied any change in smoking behavior but continued to express medium-strong desire to quit.  He had a follow up appointment with Dr. Riley Kill on 06/02/20. Patient admitted to staying up late (~2am) playing games on his phone. Denied any interests at home; there majority of time. Plays with children at times. Will occasionally ride bike and do push-ups. No current routine. Mother works most the time and not there to push him at home. Patient stated he does not like being around people. He denies depression. Has not completed vocational rehab application packet. Was not able to come up with any realistic plans or interest in future endeavors or professions. Assessment/Plan was as follows:  1. Imapired function/ADLs/mobility/cognition secondary to TBI, depressed skull fx    Gradual cognitive progress but I feel we have had a bit of a plateau  Anosmia  AGAIN discussed keeping a calendar/organizer consisently  discussed the importance of improved sleep hygiene, turning off phone, consistent bed time etc.   2. Neuroendocrine work-up ordered today including thyroid, testosterone, IGF-I, cortisol levels.  Additionally I ordered a complete metabolic panel and CBC today.ain Management:  Headaches improved after resumption of medications      Depakote 500mg  bid for headache proph    Off  propranolol 3. Mood:  Depakote as above  Continue with Dr. , gradually making progress.   Encouraged him to go out of his comfort zone a bit.  He needs to start taking on some responsibilities at home also  ordered hormonal panel today  Objective:  Physical Exam Psychiatric:        Attention and Perception: He is inattentive. He does not perceive auditory or visual hallucinations.        Mood and Affect: Mood is depressed. Mood is not anxious or elated. Affect is labile, blunt, tearful and inappropriate. Affect is not flat or angry.        Speech: He is communicative. Speech is tangential. Speech is not rapid and pressured, delayed or slurred.        Behavior: Behavior is slowed and withdrawn. Behavior is not agitated, aggressive, hyperactive or combative. Behavior is cooperative.        Thought Content: Thought content is paranoid. Thought content is not delusional. Thought content includes homicidal ideation. Thought content does not include suicidal ideation. Thought content does not include homicidal or suicidal plan.        Cognition and Memory: He exhibits impaired recent memory.        Judgment: Judgment is impulsive and inappropriate.    Assessment:   Cognitive deficit as late effect of traumatic brain injury (HCC)  Chronic post-traumatic headache, not intractable  Tobacco use disorder   Therapeutic Interventions: Motivational interviewing for smoking cessation, Behavior modification, Behavior and Supportive therapies  Effectiveness: Established problem, stable/gradually improving (1). It is felt more time is needed for Interventions to work.  Still not participating in any activities during week; reported doing "nothing" most days.   Homework: Did not complete during past 3 weeks. Brought application packet to therapy appointment. Encouraged him to complete by next scheduled appointment (07/07/19)  Plan:   Continue biweekly therapy to develop structured  routine, improve sleep hygiene, build psychosocial skills (e.g., time management, problem solving, social, communication, etc.) and coping strategies to manage stress (e.g., progressive relaxation, diaphragmatic breathing, imagery, etc.), improve mood, self esteem, and behavior via modified cognitive and behavioral techniques.      Return visit on 07/06/20  Horton Finer, PsyD

## 2020-07-06 ENCOUNTER — Encounter: Payer: Medicaid Other | Attending: Physical Medicine and Rehabilitation | Admitting: Psychology

## 2020-07-06 ENCOUNTER — Other Ambulatory Visit: Payer: Self-pay

## 2020-07-06 ENCOUNTER — Encounter: Payer: Self-pay | Admitting: Psychology

## 2020-07-06 DIAGNOSIS — R4689 Other symptoms and signs involving appearance and behavior: Secondary | ICD-10-CM | POA: Insufficient documentation

## 2020-07-06 DIAGNOSIS — S069X0S Unspecified intracranial injury without loss of consciousness, sequela: Secondary | ICD-10-CM

## 2020-07-06 DIAGNOSIS — F068 Other specified mental disorders due to known physiological condition: Secondary | ICD-10-CM | POA: Diagnosis not present

## 2020-07-06 DIAGNOSIS — R453 Demoralization and apathy: Secondary | ICD-10-CM | POA: Diagnosis not present

## 2020-07-06 DIAGNOSIS — F17200 Nicotine dependence, unspecified, uncomplicated: Secondary | ICD-10-CM | POA: Diagnosis present

## 2020-07-06 DIAGNOSIS — G44329 Chronic post-traumatic headache, not intractable: Secondary | ICD-10-CM | POA: Insufficient documentation
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
## 2020-07-21 ENCOUNTER — Encounter: Payer: Medicaid Other | Admitting: Psychology

## 2020-07-21 ENCOUNTER — Ambulatory Visit: Payer: Medicaid Other | Admitting: Psychology

## 2020-07-22 ENCOUNTER — Encounter: Payer: Medicaid Other | Attending: Physical Medicine and Rehabilitation | Admitting: Psychology

## 2020-07-22 ENCOUNTER — Other Ambulatory Visit: Payer: Self-pay

## 2020-07-22 DIAGNOSIS — R4689 Other symptoms and signs involving appearance and behavior: Secondary | ICD-10-CM | POA: Diagnosis not present

## 2020-07-22 DIAGNOSIS — F17200 Nicotine dependence, unspecified, uncomplicated: Secondary | ICD-10-CM

## 2020-07-22 DIAGNOSIS — S069X0S Unspecified intracranial injury without loss of consciousness, sequela: Secondary | ICD-10-CM

## 2020-07-22 DIAGNOSIS — G44329 Chronic post-traumatic headache, not intractable: Secondary | ICD-10-CM | POA: Diagnosis not present

## 2020-07-22 DIAGNOSIS — R453 Demoralization and apathy: Secondary | ICD-10-CM | POA: Diagnosis not present

## 2020-07-22 DIAGNOSIS — F068 Other specified mental disorders due to known physiological condition: Secondary | ICD-10-CM | POA: Diagnosis not present

## 2020-07-28 ENCOUNTER — Encounter: Payer: Self-pay | Admitting: Psychology
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
## 2020-08-03 DIAGNOSIS — F068 Other specified mental disorders due to known physiological condition: Secondary | ICD-10-CM | POA: Diagnosis not present

## 2020-08-03 DIAGNOSIS — R7989 Other specified abnormal findings of blood chemistry: Secondary | ICD-10-CM | POA: Diagnosis not present

## 2020-08-03 DIAGNOSIS — S069X0S Unspecified intracranial injury without loss of consciousness, sequela: Secondary | ICD-10-CM | POA: Diagnosis not present

## 2020-08-03 DIAGNOSIS — E039 Hypothyroidism, unspecified: Secondary | ICD-10-CM | POA: Diagnosis not present

## 2020-08-05 LAB — COMPREHENSIVE METABOLIC PANEL
ALT: 13 IU/L (ref 0–44)
AST: 13 IU/L (ref 0–40)
Albumin/Globulin Ratio: 1.6 (ref 1.2–2.2)
Albumin: 4.6 g/dL (ref 4.1–5.2)
Alkaline Phosphatase: 90 IU/L (ref 44–121)
BUN/Creatinine Ratio: 10 (ref 9–20)
BUN: 10 mg/dL (ref 6–20)
Bilirubin Total: 0.7 mg/dL (ref 0.0–1.2)
CO2: 19 mmol/L — ABNORMAL LOW (ref 20–29)
Calcium: 9.7 mg/dL (ref 8.7–10.2)
Chloride: 102 mmol/L (ref 96–106)
Creatinine, Ser: 1.03 mg/dL (ref 0.76–1.27)
GFR calc Af Amer: 118 mL/min/{1.73_m2} (ref >59)
GFR calc non Af Amer: 102 mL/min/{1.73_m2} (ref >59)
Globulin, Total: 2.9 g/dL (ref 1.5–4.5)
Glucose: 93 mg/dL (ref 65–99)
Potassium: 4.1 mmol/L (ref 3.5–5.2)
Sodium: 139 mmol/L (ref 134–144)
Total Protein: 7.5 g/dL (ref 6.0–8.5)

## 2020-08-05 LAB — T4: T4, Total: 8.6 ug/dL (ref 4.5–12.0)

## 2020-08-05 LAB — CBC
Hematocrit: 51.4 % — ABNORMAL HIGH (ref 37.5–51.0)
Hemoglobin: 17.3 g/dL (ref 13.0–17.7)
MCH: 32.3 pg (ref 26.6–33.0)
MCHC: 33.7 g/dL (ref 31.5–35.7)
MCV: 96 fL (ref 79–97)
Platelets: 248 10*3/uL (ref 150–450)
RBC: 5.35 x10E6/uL (ref 4.14–5.80)
RDW: 12.2 % (ref 11.6–15.4)
WBC: 9.9 10*3/uL (ref 3.4–10.8)

## 2020-08-05 LAB — CORTISOL-AM, BLOOD: Cortisol - AM: 14.2 ug/dL (ref 6.2–19.4)

## 2020-08-05 LAB — TSH: TSH: 2.63 u[IU]/mL (ref 0.450–4.500)

## 2020-08-05 LAB — TESTOSTERONE, FREE: Testosterone, Free: 16.3 pg/mL (ref 9.3–26.5)

## 2020-08-05 LAB — TESTOSTERONE: Testosterone: 806 ng/dL (ref 264–916)

## 2020-08-05 LAB — INSULIN-LIKE GROWTH FACTOR: Insulin-Like GF-1: 208 ng/mL (ref 109–353)

## 2020-08-05 LAB — T4, FREE: Free T4: 1.57 ng/dL (ref 0.82–1.77)

## 2020-08-10 ENCOUNTER — Ambulatory Visit: Payer: Medicaid Other | Admitting: Psychology

## 2020-08-11 ENCOUNTER — Ambulatory Visit: Payer: Medicaid Other | Admitting: Psychology

## 2020-08-24 ENCOUNTER — Encounter: Payer: Medicaid Other | Attending: Physical Medicine and Rehabilitation | Admitting: Psychology

## 2020-08-24 ENCOUNTER — Other Ambulatory Visit: Payer: Self-pay

## 2020-08-24 DIAGNOSIS — R4689 Other symptoms and signs involving appearance and behavior: Secondary | ICD-10-CM | POA: Insufficient documentation

## 2020-08-24 DIAGNOSIS — Z720 Tobacco use: Secondary | ICD-10-CM | POA: Diagnosis present

## 2020-08-24 DIAGNOSIS — G44329 Chronic post-traumatic headache, not intractable: Secondary | ICD-10-CM | POA: Diagnosis not present

## 2020-08-24 DIAGNOSIS — F068 Other specified mental disorders due to known physiological condition: Secondary | ICD-10-CM | POA: Diagnosis present

## 2020-08-24 DIAGNOSIS — F17200 Nicotine dependence, unspecified, uncomplicated: Secondary | ICD-10-CM | POA: Insufficient documentation

## 2020-08-24 DIAGNOSIS — S069X0S Unspecified intracranial injury without loss of consciousness, sequela: Secondary | ICD-10-CM | POA: Insufficient documentation

## 2020-08-24 DIAGNOSIS — R453 Demoralization and apathy: Secondary | ICD-10-CM | POA: Diagnosis not present

## 2020-08-25 ENCOUNTER — Ambulatory Visit: Payer: Medicaid Other | Admitting: Psychology

## 2020-09-01 ENCOUNTER — Other Ambulatory Visit: Payer: Self-pay

## 2020-09-01 ENCOUNTER — Encounter: Payer: Medicaid Other | Admitting: Physical Medicine & Rehabilitation

## 2020-09-01 ENCOUNTER — Encounter: Payer: Self-pay | Admitting: Physical Medicine & Rehabilitation

## 2020-09-01 VITALS — BP 127/89 | HR 78 | Temp 98.3°F | Ht 72.0 in | Wt 199.2 lb

## 2020-09-01 DIAGNOSIS — S069X0S Unspecified intracranial injury without loss of consciousness, sequela: Secondary | ICD-10-CM | POA: Diagnosis not present

## 2020-09-01 DIAGNOSIS — Z720 Tobacco use: Secondary | ICD-10-CM | POA: Diagnosis not present

## 2020-09-01 DIAGNOSIS — R4689 Other symptoms and signs involving appearance and behavior: Secondary | ICD-10-CM

## 2020-09-01 DIAGNOSIS — G44329 Chronic post-traumatic headache, not intractable: Secondary | ICD-10-CM

## 2020-09-01 DIAGNOSIS — F068 Other specified mental disorders due to known physiological condition: Secondary | ICD-10-CM | POA: Diagnosis not present

## 2020-09-01 MED ORDER — DIVALPROEX SODIUM ER 500 MG PO TB24: 1000.0000 mg | ORAL_TABLET | Freq: Every day | ORAL | 4 refills | Status: DC

## 2020-09-01 MED ORDER — NICOTINE 10 MG IN INHA: 1.0000 | RESPIRATORY_TRACT | 3 refills | Status: AC | PRN

## 2020-09-01 MED ORDER — TRAZODONE HCL 50 MG PO TABS: 50.0000 mg | ORAL_TABLET | Freq: Every day | ORAL | 2 refills | Status: DC

## 2020-09-01 NOTE — Patient Instructions (Addendum)
AT NIGHT, PERHAPS SOME SLOW MUSIC OR BACKGROUND NOISE. PERHAPS READ A BOOK OR AN ARTICLE TO HELP DECOMPRESS YOUR BRAIN. THERE ARE APPS ON YOUR PHONE TO HELP YOU RELAX TOO.  AVOID SMOKING AFTER 8PM BECAUSE NICOTINE IS A STIMULANT.
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
## 2020-09-02 ENCOUNTER — Encounter: Payer: Self-pay | Admitting: Psychology
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
## 2020-09-20 ENCOUNTER — Telehealth: Payer: Self-pay | Admitting: Psychology

## 2020-09-20 ENCOUNTER — Telehealth: Payer: Self-pay | Admitting: *Deleted

## 2020-09-20 NOTE — Telephone Encounter (Signed)
Patient's mom Lorelle Formosa would like to speak with you.  Please call her at [phone].

## 2020-09-20 NOTE — Telephone Encounter (Signed)
Prior auth for Nicotrol 10 mg Inhaler initiated and approved via CoverMyMeds. Request Reference Number: [id number]. NICOTROL INH is approved through 09/01/2021.

## 2020-09-21 ENCOUNTER — Encounter: Payer: Medicaid Other | Admitting: Psychology

## 2020-10-12 ENCOUNTER — Other Ambulatory Visit: Payer: Self-pay

## 2020-10-12 ENCOUNTER — Encounter: Payer: Medicaid Other | Attending: Physical Medicine and Rehabilitation | Admitting: Psychology

## 2020-10-12 DIAGNOSIS — Z720 Tobacco use: Secondary | ICD-10-CM

## 2020-10-12 DIAGNOSIS — S069X0S Unspecified intracranial injury without loss of consciousness, sequela: Secondary | ICD-10-CM | POA: Diagnosis present

## 2020-10-12 DIAGNOSIS — G44329 Chronic post-traumatic headache, not intractable: Secondary | ICD-10-CM

## 2020-10-12 DIAGNOSIS — R4189 Other symptoms and signs involving cognitive functions and awareness: Secondary | ICD-10-CM

## 2020-10-12 DIAGNOSIS — R4689 Other symptoms and signs involving appearance and behavior: Secondary | ICD-10-CM

## 2020-10-12 DIAGNOSIS — F068 Other specified mental disorders due to known physiological condition: Secondary | ICD-10-CM | POA: Insufficient documentation

## 2020-10-12 DIAGNOSIS — R453 Demoralization and apathy: Secondary | ICD-10-CM | POA: Diagnosis present

## 2020-10-12 DIAGNOSIS — F17200 Nicotine dependence, unspecified, uncomplicated: Secondary | ICD-10-CM | POA: Insufficient documentation
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
## 2020-10-14 ENCOUNTER — Encounter: Payer: Self-pay | Admitting: Psychology

## 2020-10-26 ENCOUNTER — Other Ambulatory Visit: Payer: Self-pay

## 2020-10-26 ENCOUNTER — Encounter: Payer: Medicaid Other | Admitting: Psychology

## 2020-10-26 DIAGNOSIS — S069X0S Unspecified intracranial injury without loss of consciousness, sequela: Secondary | ICD-10-CM

## 2020-10-26 DIAGNOSIS — R4689 Other symptoms and signs involving appearance and behavior: Secondary | ICD-10-CM | POA: Diagnosis not present

## 2020-10-26 DIAGNOSIS — G44329 Chronic post-traumatic headache, not intractable: Secondary | ICD-10-CM | POA: Diagnosis not present

## 2020-10-26 DIAGNOSIS — R453 Demoralization and apathy: Secondary | ICD-10-CM | POA: Diagnosis not present

## 2020-10-26 DIAGNOSIS — F17200 Nicotine dependence, unspecified, uncomplicated: Secondary | ICD-10-CM

## 2020-10-26 DIAGNOSIS — F068 Other specified mental disorders due to known physiological condition: Secondary | ICD-10-CM | POA: Diagnosis not present

## 2020-11-04 ENCOUNTER — Encounter: Payer: Self-pay | Admitting: Psychology
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
## 2020-11-09 ENCOUNTER — Encounter: Payer: Medicaid Other | Admitting: Psychology

## 2020-11-09 ENCOUNTER — Other Ambulatory Visit: Payer: Self-pay

## 2020-11-09 ENCOUNTER — Encounter: Payer: Self-pay | Admitting: Psychology

## 2020-11-09 DIAGNOSIS — F17200 Nicotine dependence, unspecified, uncomplicated: Secondary | ICD-10-CM

## 2020-11-09 DIAGNOSIS — S069X0S Unspecified intracranial injury without loss of consciousness, sequela: Secondary | ICD-10-CM

## 2020-11-09 DIAGNOSIS — R453 Demoralization and apathy: Secondary | ICD-10-CM | POA: Diagnosis not present

## 2020-11-09 DIAGNOSIS — F068 Other specified mental disorders due to known physiological condition: Secondary | ICD-10-CM | POA: Diagnosis not present

## 2020-11-09 DIAGNOSIS — G44329 Chronic post-traumatic headache, not intractable: Secondary | ICD-10-CM

## 2020-11-09 DIAGNOSIS — R4689 Other symptoms and signs involving appearance and behavior: Secondary | ICD-10-CM | POA: Diagnosis not present
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
## 2020-11-23 ENCOUNTER — Ambulatory Visit: Payer: Medicaid Other | Admitting: Psychology

## 2020-11-29 ENCOUNTER — Encounter: Payer: Medicaid Other | Attending: Physical Medicine and Rehabilitation | Admitting: Psychology

## 2020-11-29 DIAGNOSIS — F17200 Nicotine dependence, unspecified, uncomplicated: Secondary | ICD-10-CM | POA: Insufficient documentation

## 2020-11-29 DIAGNOSIS — F068 Other specified mental disorders due to known physiological condition: Secondary | ICD-10-CM | POA: Insufficient documentation

## 2020-11-29 DIAGNOSIS — R4689 Other symptoms and signs involving appearance and behavior: Secondary | ICD-10-CM | POA: Insufficient documentation

## 2020-11-29 DIAGNOSIS — R453 Demoralization and apathy: Secondary | ICD-10-CM | POA: Insufficient documentation

## 2020-11-29 DIAGNOSIS — G44329 Chronic post-traumatic headache, not intractable: Secondary | ICD-10-CM | POA: Insufficient documentation

## 2020-11-29 DIAGNOSIS — Z720 Tobacco use: Secondary | ICD-10-CM | POA: Insufficient documentation

## 2020-11-29 DIAGNOSIS — S069X0S Unspecified intracranial injury without loss of consciousness, sequela: Secondary | ICD-10-CM | POA: Insufficient documentation

## 2020-12-07 ENCOUNTER — Ambulatory Visit: Payer: Medicaid Other | Admitting: Psychology

## 2020-12-08 ENCOUNTER — Encounter: Payer: Medicaid Other | Admitting: Physical Medicine & Rehabilitation

## 2020-12-21 ENCOUNTER — Ambulatory Visit: Payer: Medicaid Other | Admitting: Psychology

## 2020-12-27 ENCOUNTER — Encounter: Payer: Medicaid Other | Attending: Physical Medicine and Rehabilitation | Admitting: Psychology

## 2020-12-27 DIAGNOSIS — G44329 Chronic post-traumatic headache, not intractable: Secondary | ICD-10-CM | POA: Insufficient documentation

## 2020-12-27 DIAGNOSIS — S069X0S Unspecified intracranial injury without loss of consciousness, sequela: Secondary | ICD-10-CM | POA: Insufficient documentation

## 2020-12-27 DIAGNOSIS — F17200 Nicotine dependence, unspecified, uncomplicated: Secondary | ICD-10-CM | POA: Insufficient documentation

## 2020-12-27 DIAGNOSIS — F068 Other specified mental disorders due to known physiological condition: Secondary | ICD-10-CM | POA: Insufficient documentation

## 2020-12-27 DIAGNOSIS — R4689 Other symptoms and signs involving appearance and behavior: Secondary | ICD-10-CM | POA: Insufficient documentation

## 2020-12-27 DIAGNOSIS — R453 Demoralization and apathy: Secondary | ICD-10-CM | POA: Insufficient documentation

## 2020-12-27 DIAGNOSIS — Z720 Tobacco use: Secondary | ICD-10-CM | POA: Insufficient documentation

## 2021-01-04 ENCOUNTER — Ambulatory Visit: Payer: Medicaid Other | Admitting: Psychology

## 2021-02-09 ENCOUNTER — Other Ambulatory Visit: Payer: Self-pay

## 2021-02-09 ENCOUNTER — Encounter: Payer: Self-pay | Admitting: Physical Medicine & Rehabilitation

## 2021-02-09 ENCOUNTER — Encounter: Payer: Medicaid Other | Attending: Physical Medicine and Rehabilitation | Admitting: Physical Medicine & Rehabilitation

## 2021-02-09 VITALS — BP 122/81 | HR 69 | Temp 98.1°F | Ht 72.0 in | Wt 203.8 lb

## 2021-02-09 DIAGNOSIS — R4689 Other symptoms and signs involving appearance and behavior: Secondary | ICD-10-CM | POA: Diagnosis not present

## 2021-02-09 DIAGNOSIS — T43216D Underdosing of selective serotonin and norepinephrine reuptake inhibitors, subsequent encounter: Secondary | ICD-10-CM | POA: Insufficient documentation

## 2021-02-09 DIAGNOSIS — M549 Dorsalgia, unspecified: Secondary | ICD-10-CM | POA: Diagnosis not present

## 2021-02-09 DIAGNOSIS — F17210 Nicotine dependence, cigarettes, uncomplicated: Secondary | ICD-10-CM | POA: Insufficient documentation

## 2021-02-09 DIAGNOSIS — F068 Other specified mental disorders due to known physiological condition: Secondary | ICD-10-CM | POA: Insufficient documentation

## 2021-02-09 DIAGNOSIS — X58XXXS Exposure to other specified factors, sequela: Secondary | ICD-10-CM | POA: Diagnosis not present

## 2021-02-09 DIAGNOSIS — R430 Anosmia: Secondary | ICD-10-CM | POA: Insufficient documentation

## 2021-02-09 DIAGNOSIS — S0291XS Unspecified fracture of skull, sequela: Secondary | ICD-10-CM | POA: Insufficient documentation

## 2021-02-09 DIAGNOSIS — R531 Weakness: Secondary | ICD-10-CM | POA: Diagnosis not present

## 2021-02-09 DIAGNOSIS — M79606 Pain in leg, unspecified: Secondary | ICD-10-CM | POA: Diagnosis not present

## 2021-02-09 DIAGNOSIS — S069X0S Unspecified intracranial injury without loss of consciousness, sequela: Secondary | ICD-10-CM | POA: Diagnosis not present

## 2021-02-09 DIAGNOSIS — Z79899 Other long term (current) drug therapy: Secondary | ICD-10-CM | POA: Insufficient documentation

## 2021-02-09 DIAGNOSIS — R4189 Other symptoms and signs involving cognitive functions and awareness: Secondary | ICD-10-CM | POA: Diagnosis present

## 2021-02-09 DIAGNOSIS — G8929 Other chronic pain: Secondary | ICD-10-CM | POA: Diagnosis not present

## 2021-02-09 DIAGNOSIS — F32A Depression, unspecified: Secondary | ICD-10-CM | POA: Insufficient documentation

## 2021-02-09 NOTE — Patient Instructions (Addendum)
PLEASE FEEL FREE TO CALL OUR OFFICE WITH ANY PROBLEMS OR QUESTIONS [phone])    ?ANTIDEPRESSANT  Get out and experience life

## 2021-02-09 NOTE — Progress Notes (Signed)
Subjective:    Patient ID: Billy Malone, male    DOB: 1996-05-22, 25 y.o.   MRN: [id number]  HPI  Billy Malone is here in follow up of his TBI. His headaches have been better. Sleep is fair.   Billy Malone likes to be around his son. He's doing work around the house. He cuts the grass.  Mom says he is doing more in general.  He needs to be cued a lot of times for the work that he does do.  He likes to work out and stays fairly physical fit.  I asked him how his sleep is and it still could be touching go.  He continues to smoke in the evening and he has not taken the trazodone consistently.  He states that when he has used it is been helpful.  I asked Billy Malone what he wanted to do with his life moving forward and he really struggled to find an answer for that.  He did state that he has another baby on the way in October.  Pain Inventory Average Pain 3 Pain Right Now 0 My pain is intermittent, dull, and cramping in legs  LOCATION OF PAIN  Back, right hip, both legs  BOWEL Number of stools per week: 3-4 Oral laxative use No  Type of laxative none Enema or suppository use No  History of colostomy No  Incontinent No   BLADDER Normal    Mobility ability to climb steps?  yes do you drive?  no Do you have any goals in this area?  yes  Function/ disabled: date disabled 01/28/2019 I need assistance with the following:  household duties and shopping Do you have any goals in this area?  yes  Neuro/Psych confusion depression anxiety loss of taste or smell  Prior Studies Any changes since last visit?  yes  Physicians involved in your care Any changes since last visit?  yes, Dr. Suszanne Conners   Family History  Problem Relation Age of Onset   Hypertension Maternal Grandmother    Diabetes Maternal Grandmother    Hypertension Maternal Grandfather    Diabetes Maternal Grandfather    Social History   Socioeconomic History   Marital status: Single    Spouse name: Not on file   Number of  children: Not on file   Years of education: Not on file   Highest education level: Not on file  Occupational History   Not on file  Tobacco Use   Smoking status: Some Days    Packs/day: 1.00    Types: Cigarettes   Smokeless tobacco: Never  Vaping Use   Vaping Use: Never used  Substance and Sexual Activity   Alcohol use: Not Currently   Drug use: Not Currently    Types: Marijuana, Benzodiazepines   Sexual activity: Yes  Other Topics Concern   Not on file  Social History Narrative   ** Merged History Encounter **       Social Determinants of Health   Financial Resource Strain: Not on file  Food Insecurity: Not on file  Transportation Needs: Not on file  Physical Activity: Not on file  Stress: Not on file  Social Connections: Not on file   Past Surgical History:  Procedure Laterality Date   APPENDECTOMY     TYMPANOSTOMY TUBE PLACEMENT     WISDOM TOOTH EXTRACTION     Past Medical History:  Diagnosis Date   TBI (traumatic brain injury) (HCC)    BP 122/81   Pulse 69   Temp  98.1 F (36.7 C)   Ht 6' (1.829 m)   Wt 203 lb 12.8 oz (92.4 kg)   SpO2 96%   BMI 27.64 kg/m   Opioid Risk Score:   Fall Risk Score:  `1  Depression screen PHQ 2/9  Depression screen [facility] 2/9 09/01/2020 06/02/2020  Decreased Interest 0 0  Down, Depressed, Hopeless 0 0  PHQ - 2 Score 0 0    Review of Systems     Objective:   Physical Exam  General: No acute distress HEENT: NCAT, EOMI, oral membranes moist Cards: reg rate  Chest: normal effort Abdomen: Soft, NT, ND Skin: dry, intact Extremities: no edema Psych: flat but cooperative Neuro: fair insight and awareness. Slow to engage  Musculoskeletal: no pain         Assessment & Plan:  1.  Imapired function/ADLs/mobility/cognition secondary to TBI, depressed skull fx              -encouraged by his increased engagement socially.            -anosmia              -discussed routine again             -sleep: no tobacco after  8pm                  - background noises/music to help decompress his mind.                  -continue trazodone 50mg  for sleep                  -Neuroendocrine work-up negative  2.  Pain Management:             -Depakote ER 1000mg  daily                     -off propranolol   -tylenol for breakthrough headache 3. Tobacco abuse                  -wrote for nicotrol inhaler.    4. Mood:              -depakote. Continue with current dose.             -discussed antidepressant         -new counselor  Fifteen minutes of face to face patient care time were spent during this visit. All questions were encouraged and answered.  Follow up with me in 6 mos .

## 2021-02-09 NOTE — Addendum Note (Signed)
Addended by: Faith Rogue T on: 02/09/2021 05:02 PM   Modules accepted: Orders

## 2021-04-21 ENCOUNTER — Encounter: Payer: Medicaid Other | Admitting: Psychology

## 2021-08-10 ENCOUNTER — Encounter: Payer: Medicaid Other | Admitting: Physical Medicine & Rehabilitation

## 2021-09-23 DIAGNOSIS — Z113 Encounter for screening for infections with a predominantly sexual mode of transmission: Secondary | ICD-10-CM | POA: Diagnosis not present

## 2021-09-23 DIAGNOSIS — Z114 Encounter for screening for human immunodeficiency virus [HIV]: Secondary | ICD-10-CM | POA: Diagnosis not present

## 2021-09-30 DIAGNOSIS — B009 Herpesviral infection, unspecified: Secondary | ICD-10-CM | POA: Diagnosis not present

## 2021-11-20 IMAGING — CT CT HEAD W/O CM
3 series · 16 of 47 positions shown, 19 images · non-contrast
Comparison: Head CT 01/30/2019

CLINICAL DATA: Acute headache.  Normal neuro exam.

EXAM:
CT HEAD WITHOUT CONTRAST
TECHNIQUE: Contiguous axial images were obtained from the base of the skull
through the vertex without intravenous contrast.

[Series 2: head w o · axial · 0.41mm/px · z∈[+1229,+1374]mm · 10 of 35 slices shown, 13 images]
[im 3/35  brain]
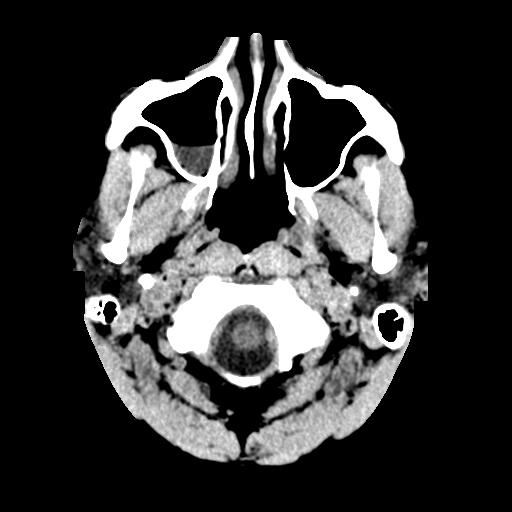
[im 3/35  bone]
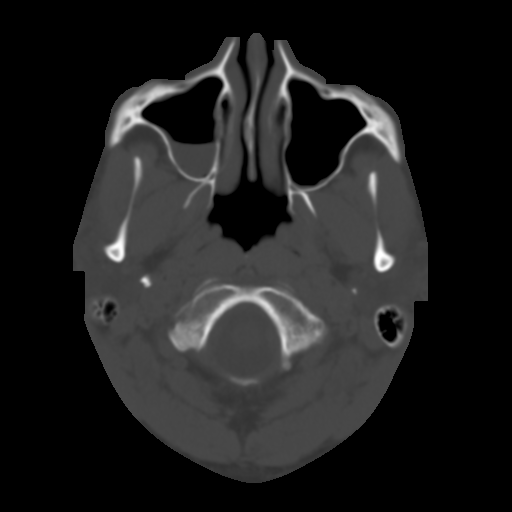
[im 6/35  brain]
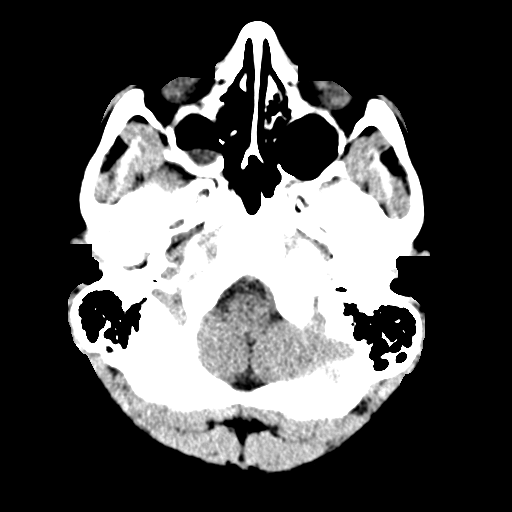
[im 10/35  brain]
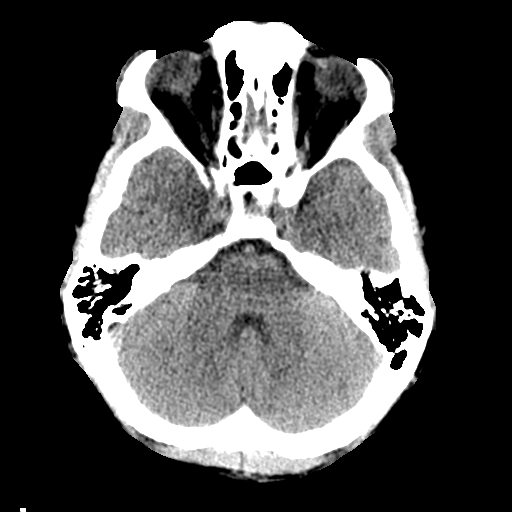
[im 12/35  brain]
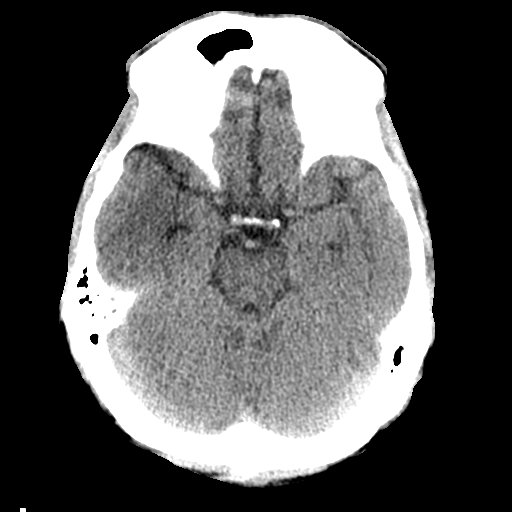
[im 16/35  brain]
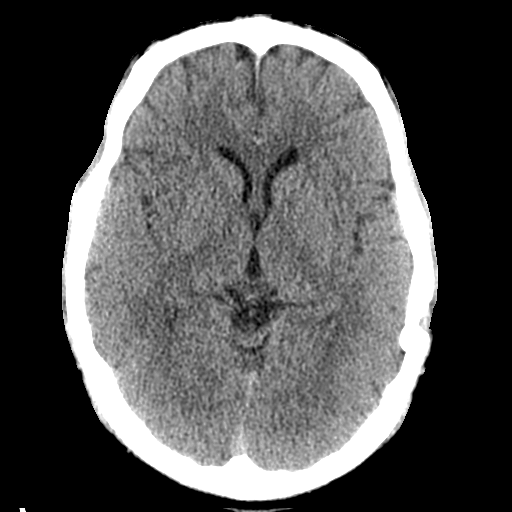
[im 16/35  bone]
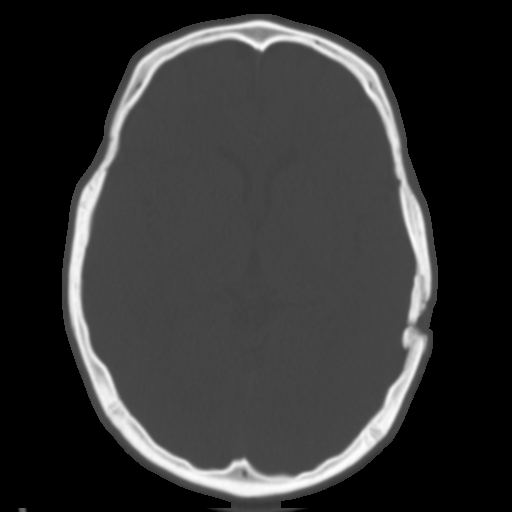
[im 19/35  brain]
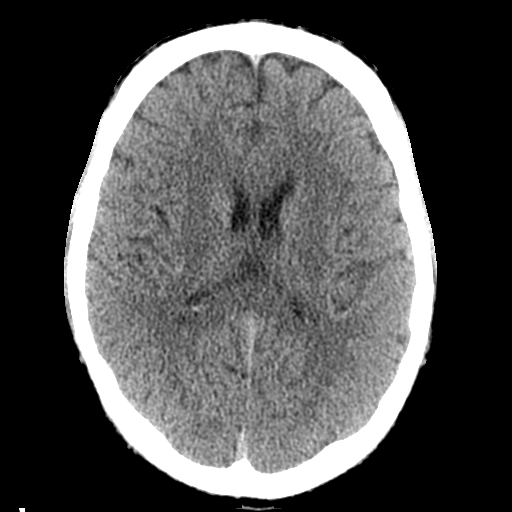
[im 23/35  brain]
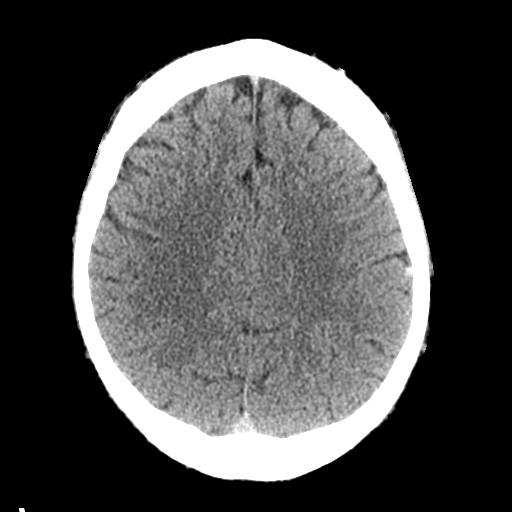
[im 26/35  brain]
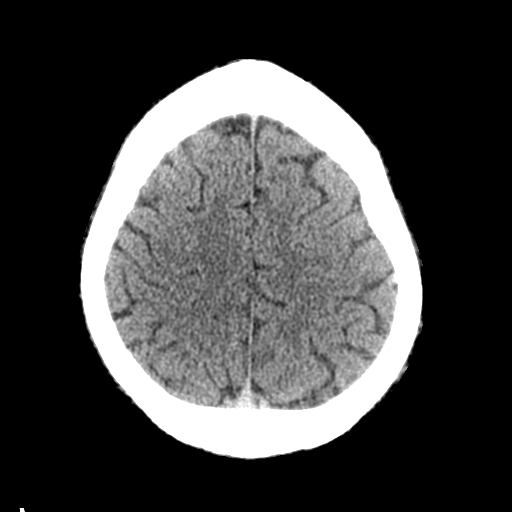
[im 29/35  brain]
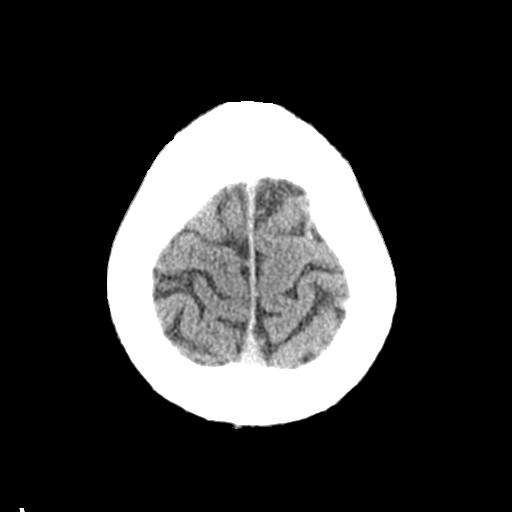
[im 29/35  bone]
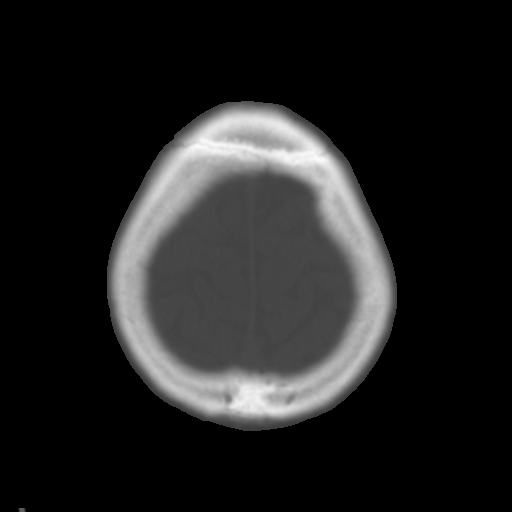
[im 32/35  brain]
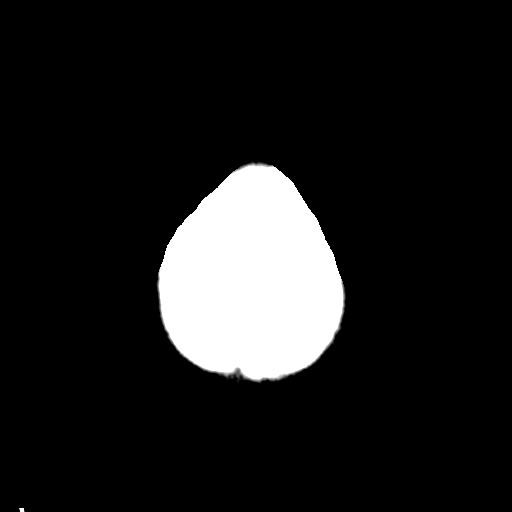

[Series 4: coronal soft · coronal · 0.36mm/px · 3 of 71 slices shown]
[im 24/71  brain]
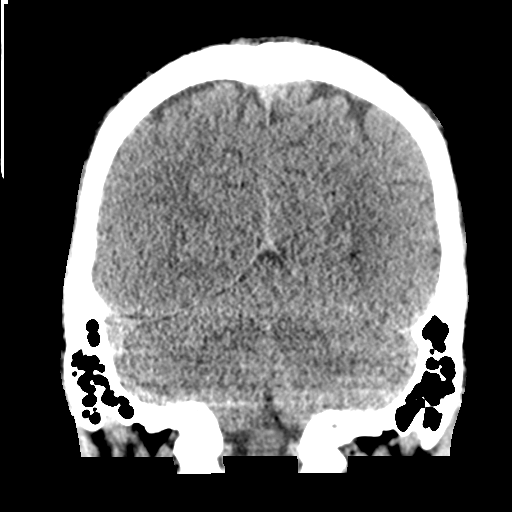
[im 32/71  brain]
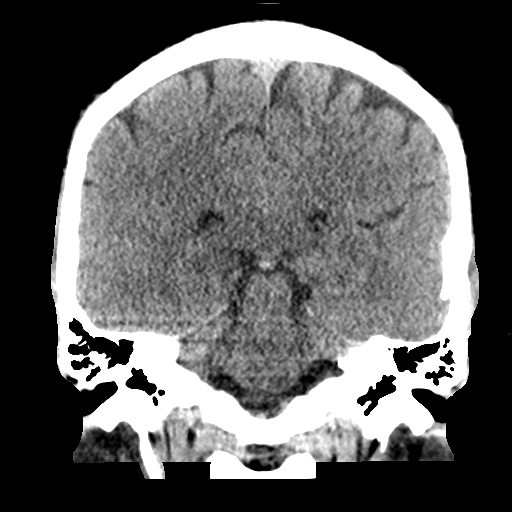
[im 39/71  brain]
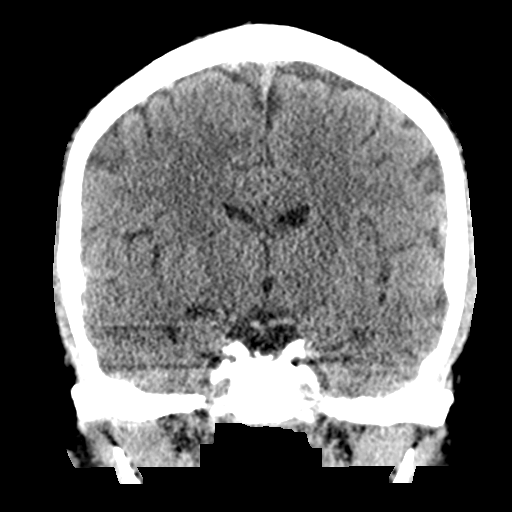

[Series 5: sagittal soft · sagittal · 0.36mm/px · 3 of 58 slices shown]
[im 20/58  brain]
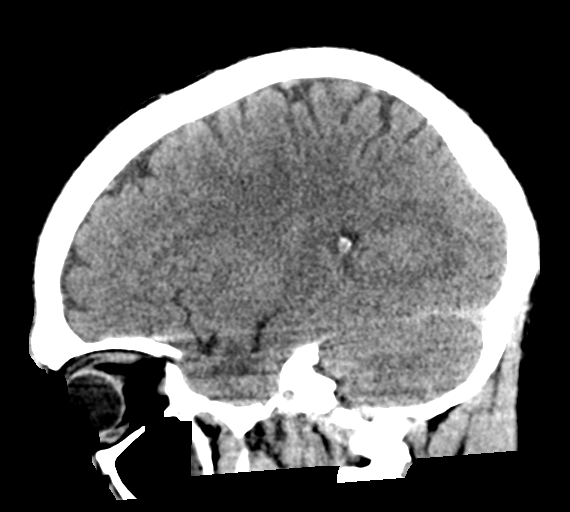
[im 29/58  brain]
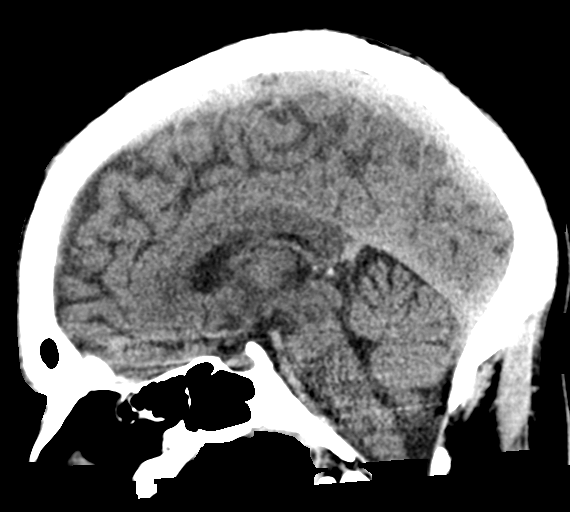
[im 39/58  brain]
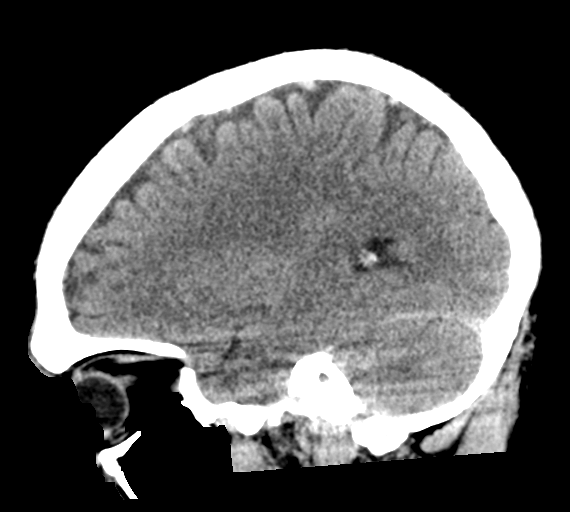

[16 of 47 positions shown; findings below may reference images not displayed]

FINDINGS: Brain: No evidence of acute infarction, hemorrhage, hydrocephalus,
extra-axial collection or mass lesion/mass effect. No definite
posttraumatic sequela in the left frontal lobe at site of prior
hemorrhage.

Vascular: No hyperdense vessel or unexpected calcification.

Skull: Prior depressed left temporal bone fracture has healed with
mild residual posttraumatic deformity. No new calvarial finding.

Sinuses/Orbits: Mucosal thickening with small fluid level in the
right maxillary sinus. Trace left maxillary mucosal thickening.
Mastoid air cells are clear. The orbits are unremarkable.

Other: None.
IMPRESSION: 1. No acute intracranial abnormality. No sequela of prior
intraparenchymal hemorrhage on the left.
2. Prior depressed left temporal bone fracture has healed with mild
residual posttraumatic deformity.
3. Right maxillary sinus disease with small fluid level, can be seen
with acute sinusitis.

## 2021-11-23 ENCOUNTER — Encounter: Payer: Medicaid Other | Attending: Physical Medicine & Rehabilitation | Admitting: Physical Medicine & Rehabilitation

## 2022-02-21 ENCOUNTER — Other Ambulatory Visit: Payer: Self-pay | Admitting: Physical Medicine & Rehabilitation

## 2022-02-21 DIAGNOSIS — R4689 Other symptoms and signs involving appearance and behavior: Secondary | ICD-10-CM

## 2022-02-21 DIAGNOSIS — G44329 Chronic post-traumatic headache, not intractable: Secondary | ICD-10-CM

## 2022-05-17 ENCOUNTER — Other Ambulatory Visit: Payer: Self-pay | Admitting: Physical Medicine & Rehabilitation

## 2022-05-17 DIAGNOSIS — G44329 Chronic post-traumatic headache, not intractable: Secondary | ICD-10-CM

## 2022-05-17 DIAGNOSIS — F068 Other specified mental disorders due to known physiological condition: Secondary | ICD-10-CM

## 2022-08-22 DIAGNOSIS — B009 Herpesviral infection, unspecified: Secondary | ICD-10-CM | POA: Diagnosis not present

## 2022-08-22 DIAGNOSIS — R569 Unspecified convulsions: Secondary | ICD-10-CM | POA: Diagnosis not present
